# Patient Record
Sex: Female | Born: 1985 | Race: Black or African American | Hispanic: No | Marital: Married | Smoking: Never smoker
Health system: Southern US, Community
[De-identification: ages and names within clinical notes are randomized; demographics above are authoritative.]

## PROBLEM LIST (undated history)

## (undated) ENCOUNTER — Inpatient Hospital Stay (HOSPITAL_COMMUNITY): Payer: Self-pay

## (undated) DIAGNOSIS — D500 Iron deficiency anemia secondary to blood loss (chronic): Secondary | ICD-10-CM

## (undated) DIAGNOSIS — M0520 Rheumatoid vasculitis with rheumatoid arthritis of unspecified site: Secondary | ICD-10-CM

## (undated) DIAGNOSIS — M069 Rheumatoid arthritis, unspecified: Secondary | ICD-10-CM

## (undated) DIAGNOSIS — L509 Urticaria, unspecified: Secondary | ICD-10-CM

## (undated) HISTORY — DX: Urticaria, unspecified: L50.9

## (undated) HISTORY — DX: Iron deficiency anemia secondary to blood loss (chronic): D50.0

## (undated) HISTORY — DX: Rheumatoid vasculitis with rheumatoid arthritis of unspecified site: M05.20

---

## 2010-03-20 ENCOUNTER — Emergency Department (HOSPITAL_COMMUNITY): Admission: EM | Admit: 2010-03-20 | Discharge: 2010-03-20 | Payer: Self-pay | Admitting: Emergency Medicine

## 2010-08-09 ENCOUNTER — Ambulatory Visit: Payer: Self-pay | Admitting: Nurse Practitioner

## 2010-08-09 ENCOUNTER — Other Ambulatory Visit: Admission: RE | Admit: 2010-08-09 | Discharge: 2010-08-09 | Payer: Self-pay | Admitting: Internal Medicine

## 2010-08-09 DIAGNOSIS — E669 Obesity, unspecified: Secondary | ICD-10-CM

## 2010-08-09 DIAGNOSIS — J45909 Unspecified asthma, uncomplicated: Secondary | ICD-10-CM | POA: Insufficient documentation

## 2010-08-09 DIAGNOSIS — N644 Mastodynia: Secondary | ICD-10-CM | POA: Insufficient documentation

## 2010-08-09 DIAGNOSIS — B3731 Acute candidiasis of vulva and vagina: Secondary | ICD-10-CM | POA: Insufficient documentation

## 2010-08-09 DIAGNOSIS — B373 Candidiasis of vulva and vagina: Secondary | ICD-10-CM

## 2010-08-09 LAB — CONVERTED CEMR LAB: Chlamydia, DNA Probe: NEGATIVE

## 2010-08-16 ENCOUNTER — Encounter (INDEPENDENT_AMBULATORY_CARE_PROVIDER_SITE_OTHER): Payer: Self-pay | Admitting: Nurse Practitioner

## 2011-01-09 ENCOUNTER — Ambulatory Visit: Admit: 2011-01-09 | Payer: Self-pay | Admitting: Nurse Practitioner

## 2011-01-09 NOTE — Letter (Signed)
Summary: Handout Printed  Printed Handout:  - Fibrocystic Breast Disease 

## 2011-01-09 NOTE — Letter (Signed)
Summary: *HSN Results Follow up  [facility]  [address] [place], [place] [address]   Phone: [phone]  Fax: [phone]      08/16/2010   [place] ASHLEY HARPER [address] [place], [place]  [address]   Dear  Ms. Shanie HARPER,                            ____S.Drinkard,FNP   ____D. Gore,FNP       ____B. McPherson,MD   ____V. Rankins,MD    ____E. Mulberry,MD    __X__N. Daphine Deutscher, FNP  ____D. Reche Dixon, MD    ____K. Philipp Deputy, MD    ____Other     This letter is to inform you that your recent test(s):  ___X____Pap Smear    _______Lab Test     _______X-ray    ____X___ is within acceptable limits  _______ requires a medication change  _______ requires a follow-up lab visit  _______ requires a follow-up visit with your provider   Comments: Pap Smear results are normal.       _________________________________________________________ If you have any questions, please contact our office [phone].                    Sincerely,    Lehman Prom FNP [facility]

## 2011-01-09 NOTE — Letter (Signed)
Summary: PT INFORMATION SHEET  PT INFORMATION SHEET   Imported By: Arta Bruce 08/10/2010 11:31:53  _____________________________________________________________________  External Attachment:    Type:   Image     Comment:   External Document

## 2011-01-09 NOTE — Assessment & Plan Note (Signed)
Summary: NEW - Establish Care   Vital Signs:  Patient profile:   25 year old female LMP:     07/2010 Height:      57.50 inches Weight:      160.4 pounds BMI:     34.23 BSA:     1.65 Temp:     97.8 degrees F oral Pulse rate:   84 / minute Pulse rhythm:   regular Resp:     20 per minute BP sitting:   120 / 70  (left arm) Cuff size:   regular  Vitals Entered By: Levon Hedger (August 09, 2010 3:05 PM)  Nutrition Counseling: Patient's BMI is greater than 25 and therefore counseled on weight management options. CC: new establish...birth control..possible lump in right breast Is Patient Diabetic? No Pain Assessment Patient in pain? no       Does patient need assistance? Functional Status Self care Ambulation Normal LMP (date): 07/2010     Enter LMP: 07/2010   CC:  new establish...birth control..possible lump in right breast.  History of Present Illness:  Pt into the [facility] office to establish care. Previously seen in [place] Moved to [facility] in 11/2009  PMH - Asthma PSH - C-section x 1  Social - pt has 1 child age 19.  She has a fiance' and is employed at [employer]  IUD - Nuvaring initially after birth of son and she had complications with it staying in place.  Mirena placed in 01/2009.  Currently with some problems that she attributes to IUD Pain with intercourse Some vaginal discharge no dysuria no change in sexual partners Menses - monthly (light flow) last PAP done 2009  Breast tender - ? lump in right breast tender to the touch at times No family history of breast cancer +coffee -chocolate  Habits & Providers  Alcohol-Tobacco-Diet     Alcohol drinks/day: 0     Tobacco Status: never  Exercise-Depression-Behavior     Have you felt down or hopeless? no     Drug Use: never  Medications Prior to Update: 1)  None  Allergies (verified): No Known Drug Allergies  Past History:  Past Surgical History: c-section x 1  Family History: mother -  Hx DVT and PE, thyroid problems father - healthy sister x 2 (healthy)  Social History: 1 child tobacco - none ETOH - social  Drug - noneSmoking Status:  never Drug Use:  never  Review of Systems CV:  Denies chest pain or discomfort. Resp:  Denies cough. GI:  Denies abdominal pain, nausea, and vomiting. GU:  Complains of discharge; painful coitus.  Physical Exam  General:  alert.   Head:  normocephalic.   Breasts:  nippled everted no masses noted but dense glandular tissue bilateral Lungs:  normal breath sounds.   Heart:  normal rate and regular rhythm.    Pelvic Exam  Vulva:      normal appearance.   Urethra and Bladder:      Urethra--normal.   Vagina:      curdlike discharge.  IUD string in place Cervix:      midposition.   Uterus:      smooth.   Adnexa:      nontender bilaterally.      Impression & Recommendations:  Problem # 1:  CANDIDIASIS, VAGINAL (ICD-112.1) handout given dx reviewed with pt  Problem # 2:  BREAST TENDERNESS (ICD-611.71) handout given self breast exam placcard given  Problem # 3:  IUD SURVEILLANCE (ICD-V25.42)  IUD string in  place  Orders: KOH/ WET [place] [phone]) Pap Smear, Thin Prep ( Collection of) [phone]) T- GC Chlamydia ([address])  Problem # 4:  OBESITY (ICD-278.00) will refer to pt to a nutrition class pt is aware that she is overweight for her height  Complete Medication List: 1)  Ventolin Hfa 108 (90 Base) Mcg/act Aers (Albuterol sulfate) .... Two puffs every 6 hours as needed for shortness of breath 2)  Terazol 7 0.4 % Crea (Terconazole) .... One applicator intravaginally x 7 nights  Patient Instructions: 1)  You have a yeast infection which was likely causing vaginal irritation.  You will be notified of the PAP smear results 2)  Follow up as needed Prescriptions: TERAZOL 7 0.4 % CREA (TERCONAZOLE) One applicator intravaginally x 7 nights  #45gm x 0   Entered and Authorized by:   Lehman Prom FNP   Signed by:    Lehman Prom FNP on 08/09/2010   Method used:   Print then Give to Patient   RxID:   [id number]   Laboratory Results  Date/Time Received: August 09, 2010 4:01 PM   [facility] Source: vaginal WBC/hpf: 1-5 Bacteria/hpf: rare Clue cells/hpf: none Yeast/hpf: moderate Wet [place] KOH: Negative Trichomonas/hpf: none

## 2011-01-09 NOTE — Letter (Signed)
Summary: Handout Printed  Printed Handout:  - Breast Problems and Self Exam

## 2011-05-02 ENCOUNTER — Ambulatory Visit (INDEPENDENT_AMBULATORY_CARE_PROVIDER_SITE_OTHER): Payer: Medicaid Other | Admitting: Family Medicine

## 2011-05-02 DIAGNOSIS — Z30432 Encounter for removal of intrauterine contraceptive device: Secondary | ICD-10-CM

## 2011-05-23 ENCOUNTER — Ambulatory Visit (INDEPENDENT_AMBULATORY_CARE_PROVIDER_SITE_OTHER): Payer: Medicaid Other | Admitting: Family Medicine

## 2011-05-23 DIAGNOSIS — Z3009 Encounter for other general counseling and advice on contraception: Secondary | ICD-10-CM

## 2011-05-24 ENCOUNTER — Other Ambulatory Visit: Payer: Self-pay | Admitting: Family Medicine

## 2011-05-24 LAB — POCT URINALYSIS DIP (DEVICE)
Bilirubin Urine: NEGATIVE
Glucose, UA: NEGATIVE mg/dL
Hgb urine dipstick: NEGATIVE
Specific Gravity, Urine: >=1.03 (ref 1.005–1.030)
Urobilinogen, UA: 1 mg/dL (ref 0.0–1.0)

## 2011-05-24 NOTE — Group Therapy Note (Unsigned)
[name], [name]                 ACCOUNT NO.:  [id number]  MEDICAL RECORD NO.:  [id number]           PATIENT TYPE:  A  LOCATION:  [facility]                   FACILITY:  [facility]  PHYSICIAN:  Lucina Mellow, DO   DATE OF BIRTH:  September 27, 1986  DATE OF SERVICE:  05/23/2011                                 CLINIC NOTE  The patient presents to the clinic on May 23, 2011.  The patient presents to the GYN clinic with followup after having her IUD removed 2 weeks ago by certified nurse midwife [facility].  The patient states that after her IUD was removed, she has had no additional pain and feels much better not having the IUD in.  She thinks that she does not want an IUD replaced at this point, especially since her fiance has started the discussion about having children fairly soon after they get married.  The patient states that she has taken birth control pills in the past has a hard time remembering them.  She is really not that interested in the Depo or the Implanon placement at this time.  She states that she has recently started her period since her IUD was removed and so far her bleeding is light and normal.  She is not having any difficult symptoms.  On exam, blood pressure 115/68, pulse is 70, temperature of 98.1, weight of 154.1 pounds, height of 58 inches.  The patient is a pleasant African American female who looks her stated age of 64, heart is regular rate and rhythm.  Lungs are clear to auscultation bilaterally.  Her thyroid is not palpable.  ASSESSMENT: 1. Family planning.  The patient states that she does not want to have     any IUD placed at this time.  Especially since the IUD is meant for     5 years of contraception and she feels at least within the next 2     years she would want to conceive again.  We discussed her options     at length and she opted to try birth control pill a prescription     for Sprintec was written for her.  We discussed  Sprintec being  a     monophasic birth control pill that if she were to miss a pill she     could easily double up, the next day and to control her symptoms.     If that were to happen, she does need to use an additional form of     contraception for about 72 hours.  She can start it today as the     patient did start her periods a few days ago.  We also discussed if     she decided that she wanted to piggyback her doses and skip the     procedure, that would also be safe and effective way to continue     birth control pill contraception if she noted she was going to have     an activity, she did not want to be on her cycle during it.  We     also discussed that birth control  pills can raise her blood     pressure and she is to follow up within 3 months so that we can     check her blood pressure and make sure that she is doing well     remembering her pills.  I also suggested to her that she should     keep her birth control pills by her toothbrush, and when she     brushes her teeth each day, she can remember to take her pill.  She     can set an alarm on her telephone to remind her to check her pill     each day or she can even elicit the help of her fiance to help her     remember to take it each day.  The patient voices understanding and     agrees with all of this, and she leaves the clinic in stable     condition.          ______________________________ Lucina Mellow, DO    SH/MEDQ  D:  05/23/2011  T:  05/24/2011  Job:  [id number]

## 2011-12-20 ENCOUNTER — Telehealth: Payer: Self-pay | Admitting: *Deleted

## 2011-12-20 NOTE — Telephone Encounter (Signed)
Patient called stating has abdominal cramping and is pregnant but not sure how far along. Pt would like a phone call back about what to do.

## 2011-12-20 NOTE — Telephone Encounter (Signed)
Called pt and left message to return our call to the clinics.  

## 2011-12-26 ENCOUNTER — Encounter (INDEPENDENT_AMBULATORY_CARE_PROVIDER_SITE_OTHER): Payer: Medicaid Other | Admitting: Family Medicine

## 2011-12-26 NOTE — Progress Notes (Signed)
Pt states she is pregnant and thought this was an appt for prenatal care. I explained that there must have been a misunderstanding. The appt given to her today was intended to be a Gyn appt and we will not be able to see her today for pregnancy care. I further stated that we are primarily a high risk referral clinic and pts are sent to [place] from the [facility]. HD when indicated. Pt states that she had called and spoke with an after hours nurse last week who advised her to be seen within 24 hrs. I stated that she may have meant for her to go to [facility] because she was having pain at the time. Pt says "No, she told me to schedule an appt." I apologized once again for the confusion and for the time that she has waited to be seen. I stated that she can go to [facility] if she feels she needs to be seen right away, however she has stated that she is not having pain at present and that area is for pts having urgent concerns and problems.  I advised pt to go to the registration window for further instructions from [place] to obtain prenatal care. Pt and her husband left the clinic but did not stop back @ the registration window to get instructions from [place].

## 2011-12-27 NOTE — Progress Notes (Deleted)
This encounter was created in error - please disregard.

## 2012-01-03 ENCOUNTER — Encounter (HOSPITAL_COMMUNITY): Payer: Self-pay | Admitting: *Deleted

## 2012-01-03 ENCOUNTER — Inpatient Hospital Stay (HOSPITAL_COMMUNITY)
Admission: AD | Admit: 2012-01-03 | Discharge: 2012-01-03 | Disposition: A | Discharge status: Home / Self Care | Payer: Medicaid Other | Source: Ambulatory Visit | Attending: Obstetrics & Gynecology | Admitting: Obstetrics & Gynecology

## 2012-01-03 ENCOUNTER — Inpatient Hospital Stay (HOSPITAL_COMMUNITY): Payer: Medicaid Other

## 2012-01-03 DIAGNOSIS — O99891 Other specified diseases and conditions complicating pregnancy: Secondary | ICD-10-CM | POA: Insufficient documentation

## 2012-01-03 DIAGNOSIS — O9989 Other specified diseases and conditions complicating pregnancy, childbirth and the puerperium: Secondary | ICD-10-CM

## 2012-01-03 DIAGNOSIS — R109 Unspecified abdominal pain: Secondary | ICD-10-CM | POA: Insufficient documentation

## 2012-01-03 LAB — CBC
HCT: 39.9 % (ref 36.0–46.0)
Hemoglobin: 13.1 g/dL (ref 12.0–15.0)
RBC: 4.97 MIL/uL (ref 3.87–5.11)

## 2012-01-03 LAB — WET PREP, GENITAL

## 2012-01-03 LAB — DIFFERENTIAL
Lymphocytes Relative: 33 % (ref 12–46)
Lymphs Abs: 2.1 10*3/uL (ref 0.7–4.0)
Monocytes Relative: 11 % (ref 3–12)
Neutro Abs: 3.5 10*3/uL (ref 1.7–7.7)
Neutrophils Relative %: 55 % (ref 43–77)

## 2012-01-03 LAB — URINALYSIS, ROUTINE W REFLEX MICROSCOPIC
Glucose, UA: NEGATIVE mg/dL
Hgb urine dipstick: NEGATIVE
Specific Gravity, Urine: 1.015 (ref 1.005–1.030)

## 2012-01-03 LAB — ABO/RH: ABO/RH(D): O POS

## 2012-01-03 NOTE — ED Provider Notes (Signed)
History     CSN: [id number]  Arrival date & time 01/03/12  1812   None     Chief Complaint  Patient presents with  . Abdominal Pain   HPI [name] is a 26 y.o. female who presents to [facility] for abdominal pain in early pregnancy. Pain is located in the lower abdomen and described as cramping pain that feels like a period cramp. Denies vaginal bleeding or discharge. LMP 11/08/11, last pap smear less than one year ago and was normal at [facility]. Current sex partner x 5 years. No history of STI's.  Past Medical History  Diagnosis Date  . Asthma     Past Surgical History  Procedure Date  . Cesarean section     History reviewed. No pertinent family history.  History  Substance Use Topics  . Smoking status: Never Smoker   . Smokeless tobacco: Not on file  . Alcohol Use: Yes    OB History    Grav Para Term Preterm Abortions TAB SAB Ect Mult Living   2 1        1       Review of Systems  Constitutional: Negative for fever, chills, diaphoresis and fatigue.  HENT: Negative for ear pain, congestion, sore throat, facial swelling, neck pain, neck stiffness, dental problem and sinus pressure.   Eyes: Negative for photophobia, pain and discharge.  Respiratory: Negative for cough, chest tightness and wheezing.   Gastrointestinal: Positive for abdominal pain (mild cramping). Negative for nausea, vomiting, diarrhea, constipation and abdominal distention.  Genitourinary: Positive for vaginal discharge. Negative for dysuria, frequency, flank pain, vaginal bleeding and difficulty urinating.  Musculoskeletal: Negative for myalgias, back pain and gait problem.  Skin: Negative for color change and rash.  Neurological: Positive for headaches. Negative for dizziness, speech difficulty, weakness, light-headedness and numbness.  Psychiatric/Behavioral: Negative for confusion and agitation.    Allergies  Review of patient's allergies indicates no known allergies.  Home Medications    No current outpatient prescriptions on file.  BP 137/70  Pulse 81  Temp(Src) 98.8 F (37.1 C) (Oral)  Resp 20  Ht 4' 10.5" (1.486 m)  Wt 161 lb (73.029 kg)  BMI 33.08 kg/m2  SpO2 99%  LMP 11/09/2011  Physical Exam  Nursing note and vitals reviewed. Constitutional: She is oriented to person, place, and time. She appears well-developed and well-nourished.  HENT:  Head: Normocephalic.  Eyes: EOM are normal.  Neck: Neck supple.  Cardiovascular: Normal rate.   Pulmonary/Chest: Effort normal.  Abdominal: Soft. There is no tenderness.  Genitourinary:       External genitalia without lesions. White creamy discharge vaginal vault. Cervix closed, long, no CMT, mildly tender left adnexa, no mass palpated. Uterus slightly enlarged.  Musculoskeletal: Normal range of motion.  Neurological: She is alert and oriented to person, place, and time. No cranial nerve deficit.  Skin: Skin is warm and dry.  Psychiatric: She has a normal mood and affect. Her behavior is normal. Judgment and thought content normal.   Results for orders placed during the hospital encounter of 01/03/12 (from the past 24 hour(s))  URINALYSIS, ROUTINE W REFLEX MICROSCOPIC     Status: Abnormal   Collection Time   01/03/12  7:00 PM      Component Value Range   Color, Urine YELLOW  YELLOW    APPearance CLEAR  CLEAR    Specific Gravity, Urine 1.015  1.005 - 1.030    pH 7.0  5.0 - 8.0    Glucose,  UA NEGATIVE  NEGATIVE (mg/dL)   Hgb urine dipstick NEGATIVE  NEGATIVE    Bilirubin Urine NEGATIVE  NEGATIVE    Ketones, ur 15 (*) NEGATIVE (mg/dL)   Protein, ur NEGATIVE  NEGATIVE (mg/dL)   Urobilinogen, UA 1.0  0.0 - 1.0 (mg/dL)   Nitrite NEGATIVE  NEGATIVE    Leukocytes, UA NEGATIVE  NEGATIVE   POCT PREGNANCY, URINE     Status: Abnormal   Collection Time   01/03/12  7:59 PM      Component Value Range   Preg Test, Ur POSITIVE (*) NEGATIVE   CBC     Status: Normal   Collection Time   01/03/12  8:20 PM      Component Value  Range   WBC 6.4  4.0 - 10.5 (K/uL)   RBC 4.97  3.87 - 5.11 (MIL/uL)   Hemoglobin 13.1  12.0 - 15.0 (g/dL)   HCT 45.4  09.8 - 11.9 (%)   MCV 80.3  78.0 - 100.0 (fL)   MCH 26.4  26.0 - 34.0 (pg)   MCHC 32.8  30.0 - 36.0 (g/dL)   RDW 14.7  82.9 - 56.2 (%)   Platelets 312  150 - 400 (K/uL)  DIFFERENTIAL     Status: Normal   Collection Time   01/03/12  8:20 PM      Component Value Range   Neutrophils Relative 55  43 - 77 (%)   Neutro Abs 3.5  1.7 - 7.7 (K/uL)   Lymphocytes Relative 33  12 - 46 (%)   Lymphs Abs 2.1  0.7 - 4.0 (K/uL)   Monocytes Relative 11  3 - 12 (%)   Monocytes Absolute 0.7  0.1 - 1.0 (K/uL)   Eosinophils Relative 1  0 - 5 (%)   Eosinophils Absolute 0.0  0.0 - 0.7 (K/uL)   Basophils Relative 1  0 - 1 (%)   Basophils Absolute 0.0  0.0 - 0.1 (K/uL)  ABO/RH     Status: Normal   Collection Time   01/03/12  8:20 PM      Component Value Range   ABO/RH(D) O POS    WET PREP, GENITAL     Status: Abnormal   Collection Time   01/03/12  8:26 PM      Component Value Range   Yeast, Wet Prep NONE SEEN  NONE SEEN    Trich, Wet Prep NONE SEEN  NONE SEEN    Clue Cells, Wet Prep FEW (*) NONE SEEN    WBC, Wet Prep HPF POC FEW (*) NONE SEEN    US Ob Comp Less 14 Wks  01/03/2012  *RADIOLOGY REPORT*  Clinical Data: Pelvic cramping.  OBSTETRIC <14 WK ULTRASOUND  Technique:  Transabdominal ultrasound was performed for evaluation of the gestation as well as the maternal uterus and adnexal regions.  Comparison:  None.  Intrauterine gestational sac: Visualized/normal in shape. Yolk sac: Yes Embryo: Yes Cardiac Activity: Yes Heart Rate: 155 bpm  CRL:  1.54 cm  8w  0d         [place] EDC: 08/14/2012  Maternal uterus/Adnexae: The uterus is otherwise unremarkable in appearance.  No subchorionic hemorrhage is seen.  The ovaries are within normal limits.  The right ovary measures 3.5 x 1.7 x 1.4 cm, while the left ovary measures 2.0 x 1.5 x 1.7 cm. No suspicious adnexal masses are seen.  There is no  evidence for ovarian torsion.  No free fluid is seen within the pelvic cul-de-sac.  IMPRESSION: Single  live intrauterine pregnancy noted, with a crown-rump length of 1.54 cm, corresponding to a gestational age of [redacted] weeks 0 days. This matches the gestational age of [redacted] weeks 6 days by LMP, reflecting an estimated date of delivery of August 15, 2012.  Original Report Authenticated By: Tonia Ghent, M.D.   Assessment: Viable IUP   Abdominal pain in early pregnany  Plan:  Start prenatal Care   Return as needed for problems ED Course  Procedures   MDM          [facility], NP 01/03/12 2143

## 2012-01-03 NOTE — Progress Notes (Signed)
+  HPT wk or 2 ago.  Cramping.

## 2012-01-16 ENCOUNTER — Encounter (HOSPITAL_COMMUNITY): Payer: Self-pay | Admitting: *Deleted

## 2012-01-16 ENCOUNTER — Emergency Department (HOSPITAL_COMMUNITY)
Admission: EM | Admit: 2012-01-16 | Discharge: 2012-01-16 | Disposition: A | Discharge status: Home / Self Care | Payer: Medicaid Other | Attending: Emergency Medicine | Admitting: Emergency Medicine

## 2012-01-16 DIAGNOSIS — J40 Bronchitis, not specified as acute or chronic: Secondary | ICD-10-CM

## 2012-01-16 DIAGNOSIS — R059 Cough, unspecified: Secondary | ICD-10-CM | POA: Insufficient documentation

## 2012-01-16 DIAGNOSIS — R070 Pain in throat: Secondary | ICD-10-CM | POA: Insufficient documentation

## 2012-01-16 DIAGNOSIS — J45909 Unspecified asthma, uncomplicated: Secondary | ICD-10-CM | POA: Insufficient documentation

## 2012-01-16 DIAGNOSIS — R05 Cough: Secondary | ICD-10-CM | POA: Insufficient documentation

## 2012-01-16 DIAGNOSIS — O9989 Other specified diseases and conditions complicating pregnancy, childbirth and the puerperium: Secondary | ICD-10-CM | POA: Insufficient documentation

## 2012-01-16 MED ORDER — AMOXICILLIN 500 MG PO CAPS: 500.0000 mg | ORAL_CAPSULE | Freq: Three times a day (TID) | ORAL | Status: AC

## 2012-01-16 NOTE — ED Provider Notes (Signed)
History     CSN: [id number]  Arrival date & time 01/16/12  4098   First MD Initiated Contact with Patient 01/16/12 1050      Chief Complaint  Patient presents with  . Influenza    (Consider location/radiation/quality/duration/timing/severity/associated sxs/prior treatment) Patient is a 26 y.o. female presenting with flu symptoms. The history is provided by the patient.  Influenza   patient here complaining of cough x5 days has been nonproductive. No fever, vomiting, diarrhea. Has been using over-the-counter medications with slight relief. Notes positive sick exposures at home. Some sore throat, no ear pain. Denies any neck pain or stiffness. Denies being short of breath. No urinary symptoms, denies any vaginal bleeding. Patient is [redacted] weeks pregnant. Nothing makes her symptoms worse  Past Medical History  Diagnosis Date  . Asthma     Past Surgical History  Procedure Date  . Cesarean section     History reviewed. No pertinent family history.  History  Substance Use Topics  . Smoking status: Never Smoker   . Smokeless tobacco: Not on file  . Alcohol Use: Yes    OB History    Grav Para Term Preterm Abortions TAB SAB Ect Mult Living   2 1        1       Review of Systems  All other systems reviewed and are negative.    Allergies  Review of patient's allergies indicates no known allergies.  Home Medications   Current Outpatient Rx  Name Route Sig Dispense Refill  . PSEUDOEPHEDRINE HCL ER 120 MG PO TB12 Oral Take 120 mg by mouth daily as needed. Congestion      BP 114/67  Pulse 95  Temp(Src) 98 F (36.7 C) (Oral)  Resp 16  SpO2 95%  LMP 11/09/2011  Physical Exam  Nursing note and vitals reviewed. Constitutional: She is oriented to person, place, and time. She appears well-developed and well-nourished.  Non-toxic appearance.  HENT:  Head: Normocephalic and atraumatic.  Eyes: Conjunctivae are normal. Pupils are equal, round, and reactive to light.  Neck:  Normal range of motion.  Cardiovascular: Normal rate.   Pulmonary/Chest: Effort normal.  Neurological: She is alert and oriented to person, place, and time.  Skin: Skin is warm and dry.  Psychiatric: She has a normal mood and affect.    ED Course  Procedures (including critical care time)  Labs Reviewed - No data to display No results found.   No diagnosis found.    MDM  Patient will be given prescription for amoxicillin to take only if she gets worse with her current symptoms.        Toy Baker, MD 01/16/12 [phone]

## 2012-01-16 NOTE — ED Notes (Signed)
Pt here  With flu like symptoms that started this past sat. Pt has a cough and body aches , pt is [redacted] weeks pregnant

## 2012-02-02 ENCOUNTER — Inpatient Hospital Stay (HOSPITAL_COMMUNITY)
Admission: AD | Admit: 2012-02-02 | Discharge: 2012-02-02 | Disposition: A | Discharge status: Home / Self Care | Payer: Medicaid Other | Source: Ambulatory Visit | Attending: Obstetrics and Gynecology | Admitting: Obstetrics and Gynecology

## 2012-02-02 ENCOUNTER — Encounter (HOSPITAL_COMMUNITY): Payer: Self-pay | Admitting: Advanced Practice Midwife

## 2012-02-02 DIAGNOSIS — N760 Acute vaginitis: Secondary | ICD-10-CM | POA: Insufficient documentation

## 2012-02-02 DIAGNOSIS — N949 Unspecified condition associated with female genital organs and menstrual cycle: Secondary | ICD-10-CM

## 2012-02-02 DIAGNOSIS — O2390 Unspecified genitourinary tract infection in pregnancy, unspecified trimester: Secondary | ICD-10-CM | POA: Insufficient documentation

## 2012-02-02 DIAGNOSIS — A499 Bacterial infection, unspecified: Secondary | ICD-10-CM | POA: Insufficient documentation

## 2012-02-02 DIAGNOSIS — B9689 Other specified bacterial agents as the cause of diseases classified elsewhere: Secondary | ICD-10-CM | POA: Insufficient documentation

## 2012-02-02 DIAGNOSIS — M545 Low back pain, unspecified: Secondary | ICD-10-CM | POA: Insufficient documentation

## 2012-02-02 DIAGNOSIS — R1032 Left lower quadrant pain: Secondary | ICD-10-CM | POA: Insufficient documentation

## 2012-02-02 LAB — URINALYSIS, ROUTINE W REFLEX MICROSCOPIC
Ketones, ur: 40 mg/dL — AB
Leukocytes, UA: NEGATIVE
Nitrite: NEGATIVE
Specific Gravity, Urine: 1.015 (ref 1.005–1.030)
pH: 6 (ref 5.0–8.0)

## 2012-02-02 LAB — WET PREP, GENITAL: Yeast Wet Prep HPF POC: NONE SEEN

## 2012-02-02 LAB — URINE MICROSCOPIC-ADD ON

## 2012-02-02 MED ORDER — CYCLOBENZAPRINE HCL 10 MG PO TABS
10.0000 mg | ORAL_TABLET | Freq: Once | ORAL | Status: AC
  Administered 2012-02-02: 10 mg via ORAL
  Filled 2012-02-02: qty 1

## 2012-02-02 MED ORDER — IBUPROFEN 600 MG PO TABS
600.0000 mg | ORAL_TABLET | Freq: Once | ORAL | Status: AC
  Administered 2012-02-02: 600 mg via ORAL
  Filled 2012-02-02: qty 1

## 2012-02-02 MED ORDER — METRONIDAZOLE 500 MG PO TABS: 500.0000 mg | ORAL_TABLET | Freq: Two times a day (BID) | ORAL | Status: AC

## 2012-02-02 NOTE — Progress Notes (Signed)
Persistent abdominal pain and back pain x 2 to 3 days, no vaginal bleeding no discharge 12 weeks

## 2012-02-02 NOTE — ED Provider Notes (Signed)
History     Chief Complaint  Patient presents with  . Abdominal Pain  . Back Pain   HPI 26 y.o. G2P1 at [redacted]w[redacted]d c/o persistent left lower quadrant and low back pain x 3 days. Some improvement with massage. No bleeding, discharge, n/v, fever, chills, dysuria. Has not started prenatal care yet, but has called for appointment.   Past Medical History  Diagnosis Date  . Asthma     Past Surgical History  Procedure Date  . Cesarean section     No family history on file.  History  Substance Use Topics  . Smoking status: Never Smoker   . Smokeless tobacco: Not on file  . Alcohol Use: Yes    Allergies: No Known Allergies  Prescriptions prior to admission  Medication Sig Dispense Refill  . albuterol (PROVENTIL HFA;VENTOLIN HFA) 108 (90 BASE) MCG/ACT inhaler Inhale 2 puffs into the lungs every 6 (six) hours as needed. Asthma attacks, emergency use only.      . Prenatal Vit-Fe Fumarate-FA (PRENATAL MULTIVITAMIN) TABS Take 1 tablet by mouth at bedtime.      Marland Kitchen DISCONTD: AMOXICILLIN PO Take 1 tablet by mouth 2 (two) times daily. Pt does not know strength, she D/Cd this herself after 2 days.        Review of Systems  Constitutional: Negative.   Respiratory: Negative.   Cardiovascular: Negative.   Gastrointestinal: Positive for abdominal pain. Negative for nausea, vomiting, diarrhea and constipation.  Genitourinary: Negative for dysuria, urgency, frequency, hematuria and flank pain.       Negative for vaginal bleeding, vaginal discharge  Musculoskeletal: Positive for back pain.  Neurological: Negative.   Psychiatric/Behavioral: Negative.    Physical Exam   Blood pressure 108/72, pulse 116, temperature 97.7 F (36.5 C), temperature source Oral, resp. rate 16, height 4\' 10"  (1.473 m), weight 155 lb 6.4 oz (70.489 kg), last menstrual period 11/09/2011.  Physical Exam  Constitutional: She is oriented to person, place, and time. She appears well-developed and well-nourished. No  distress.  HENT:  Head: Normocephalic and atraumatic.  Cardiovascular: Normal rate, regular rhythm and normal heart sounds.   Respiratory: Effort normal and breath sounds normal. No respiratory distress.  GI: Soft. Bowel sounds are normal. She exhibits no distension and no mass. There is tenderness (LLQ). There is no rebound, no guarding and no CVA tenderness.  Genitourinary: There is no rash or lesion on the right labia. There is no rash or lesion on the left labia. Uterus is not deviated, not enlarged, not fixed and not tender. Cervix exhibits no motion tenderness, no discharge and no friability. Right adnexum displays no mass, no tenderness and no fullness. Left adnexum displays no mass, no tenderness and no fullness. No erythema, tenderness or bleeding around the vagina. No vaginal discharge found.  Neurological: She is alert and oriented to person, place, and time.  Skin: Skin is warm and dry.  Psychiatric: She has a normal mood and affect.    MAU Course  Procedures  Results for orders placed during the hospital encounter of 02/02/12 (from the past 24 hour(s))  URINALYSIS, ROUTINE W REFLEX MICROSCOPIC     Status: Abnormal   Collection Time   02/02/12  5:30 PM      Component Value Range   Color, Urine YELLOW  YELLOW    APPearance CLEAR  CLEAR    Specific Gravity, Urine 1.015  1.005 - 1.030    pH 6.0  5.0 - 8.0    Glucose, UA NEGATIVE  NEGATIVE (  mg/dL)   Hgb urine dipstick TRACE (*) NEGATIVE    Bilirubin Urine NEGATIVE  NEGATIVE    Ketones, ur 40 (*) NEGATIVE (mg/dL)   Protein, ur NEGATIVE  NEGATIVE (mg/dL)   Urobilinogen, UA 0.2  0.0 - 1.0 (mg/dL)   Nitrite NEGATIVE  NEGATIVE    Leukocytes, UA NEGATIVE  NEGATIVE   URINE MICROSCOPIC-ADD ON     Status: Abnormal   Collection Time   02/02/12  5:30 PM      Component Value Range   Squamous Epithelial / LPF FEW (*) RARE    RBC / HPF 0-2  <3 (RBC/hpf)   Bacteria, UA RARE  RARE   WET PREP, GENITAL     Status: Abnormal   Collection  Time   02/02/12  5:55 PM      Component Value Range   Yeast Wet Prep HPF POC NONE SEEN  NONE SEEN    Trich, Wet Prep NONE SEEN  NONE SEEN    Clue Cells Wet Prep HPF POC MODERATE (*) NONE SEEN    WBC, Wet Prep HPF POC FEW (*) NONE SEEN      Assessment and Plan  26 y.o. G2P1 at [redacted]w[redacted]d BV - rx flagyl Round ligament pain - rev'd comfort measures  Start prenatal care as soon as possible  Albert Hersch 02/02/2012, 6:30 PM

## 2012-02-04 NOTE — ED Provider Notes (Signed)
Agree with above note.  [name] 02/04/2012 9:01 AM

## 2012-02-05 LAB — OB RESULTS CONSOLE ABO/RH: RH Type: POSITIVE

## 2012-02-05 LAB — OB RESULTS CONSOLE HEPATITIS B SURFACE ANTIGEN: Hepatitis B Surface Ag: NEGATIVE

## 2012-07-17 ENCOUNTER — Other Ambulatory Visit: Payer: Self-pay | Admitting: Obstetrics and Gynecology

## 2012-08-08 ENCOUNTER — Encounter (HOSPITAL_COMMUNITY): Payer: Self-pay | Admitting: Pharmacist

## 2012-08-20 ENCOUNTER — Encounter (HOSPITAL_COMMUNITY): Payer: Self-pay

## 2012-08-20 NOTE — Patient Instructions (Signed)
   Your procedure is scheduled ZO:[id number] September 17th  Enter through the Main Entrance of [facility] at: 10am Pick up the phone at the desk and dial [phone] and inform us of your arrival.  Please call this number if you have any problems the morning of surgery: [phone]  Remember: Do not eat food after midnight: Monday You may have water until 7:30am then nothing Take these medicines the morning of surgery with a SIP OF WATER:  Do not wear jewelry, make-up, or FINGER nail polish No metal in your hair or on your body. Do not wear lotions, powders, perfumes or deodorant. Do not shave 48 hours prior to surgery. Do not bring valuables to the hospital. Contacts, dentures or bridgework may not be worn into surgery.  Leave suitcase in the car. After Surgery it may be brought to your room. For patients being admitted to the hospital, checkout time is 11:00am the day of discharge.     Remember to use your hibiclens as instructed.Please shower with 1/2 bottle the evening before your surgery and the other 1/2 bottle the morning of surgery. Neck down avoiding private area.

## 2012-08-21 ENCOUNTER — Encounter (HOSPITAL_COMMUNITY)
Admission: AD | Disposition: A | Discharge status: Home / Self Care | Payer: Self-pay | Source: Ambulatory Visit | Attending: Obstetrics and Gynecology

## 2012-08-21 ENCOUNTER — Encounter (HOSPITAL_COMMUNITY): Payer: Self-pay | Admitting: *Deleted

## 2012-08-21 ENCOUNTER — Encounter (HOSPITAL_COMMUNITY): Payer: Self-pay | Admitting: Anesthesiology

## 2012-08-21 ENCOUNTER — Inpatient Hospital Stay (HOSPITAL_COMMUNITY): Admission: RE | Admit: 2012-08-21 | Discharge: 2012-08-21 | Payer: Medicaid Other | Source: Ambulatory Visit

## 2012-08-21 ENCOUNTER — Inpatient Hospital Stay (HOSPITAL_COMMUNITY)
Admission: AD | Admit: 2012-08-21 | Discharge: 2012-08-24 | DRG: 766 | Disposition: A | Discharge status: Home / Self Care | Payer: Medicaid Other | Source: Ambulatory Visit | Attending: Obstetrics and Gynecology | Admitting: Obstetrics and Gynecology

## 2012-08-21 ENCOUNTER — Inpatient Hospital Stay (HOSPITAL_COMMUNITY): Payer: Medicaid Other | Admitting: Anesthesiology

## 2012-08-21 DIAGNOSIS — N644 Mastodynia: Secondary | ICD-10-CM

## 2012-08-21 DIAGNOSIS — E669 Obesity, unspecified: Secondary | ICD-10-CM

## 2012-08-21 DIAGNOSIS — J45909 Unspecified asthma, uncomplicated: Secondary | ICD-10-CM

## 2012-08-21 DIAGNOSIS — O34219 Maternal care for unspecified type scar from previous cesarean delivery: Secondary | ICD-10-CM | POA: Diagnosis present

## 2012-08-21 DIAGNOSIS — B373 Candidiasis of vulva and vagina: Secondary | ICD-10-CM

## 2012-08-21 LAB — CBC
Hemoglobin: 12.8 g/dL (ref 12.0–15.0)
MCH: 27.1 pg (ref 26.0–34.0)
MCV: 80.1 fL (ref 78.0–100.0)
RBC: 4.73 MIL/uL (ref 3.87–5.11)
WBC: 8.7 10*3/uL (ref 4.0–10.5)

## 2012-08-21 LAB — PREPARE RBC (CROSSMATCH)

## 2012-08-21 SURGERY — Surgical Case
Anesthesia: Epidural

## 2012-08-21 MED ORDER — SCOPOLAMINE 1 MG/3DAYS TD PT72
1.0000 | MEDICATED_PATCH | Freq: Once | TRANSDERMAL | Status: DC
  Administered 2012-08-21: 1.5 mg via TRANSDERMAL

## 2012-08-21 MED ORDER — PHENYLEPHRINE 40 MCG/ML (10ML) SYRINGE FOR IV PUSH (FOR BLOOD PRESSURE SUPPORT)
80.0000 ug | PREFILLED_SYRINGE | INTRAVENOUS | Status: DC | PRN
  Filled 2012-08-21: qty 5

## 2012-08-21 MED ORDER — LACTATED RINGERS IV SOLN
INTRAVENOUS | Status: DC | PRN
  Administered 2012-08-21 (×3): via INTRAVENOUS

## 2012-08-21 MED ORDER — MORPHINE SULFATE (PF) 0.5 MG/ML IJ SOLN
INTRAMUSCULAR | Status: DC | PRN
  Administered 2012-08-21: 4 mg via EPIDURAL

## 2012-08-21 MED ORDER — TERBUTALINE SULFATE 1 MG/ML IJ SOLN: 0.2500 mg | Freq: Once | INTRAMUSCULAR | Status: DC | PRN

## 2012-08-21 MED ORDER — MEPERIDINE HCL 25 MG/ML IJ SOLN
6.2500 mg | INTRAMUSCULAR | Status: DC | PRN
  Administered 2012-08-21: 6.25 mg via INTRAVENOUS

## 2012-08-21 MED ORDER — OXYCODONE-ACETAMINOPHEN 5-325 MG PO TABS: 1.0000 | ORAL_TABLET | ORAL | Status: DC | PRN

## 2012-08-21 MED ORDER — CEFAZOLIN SODIUM-DEXTROSE 2-3 GM-% IV SOLR
INTRAVENOUS | Status: AC
  Filled 2012-08-21: qty 50

## 2012-08-21 MED ORDER — PHENYLEPHRINE 40 MCG/ML (10ML) SYRINGE FOR IV PUSH (FOR BLOOD PRESSURE SUPPORT)
PREFILLED_SYRINGE | INTRAVENOUS | Status: AC
  Filled 2012-08-21: qty 10

## 2012-08-21 MED ORDER — PHENYLEPHRINE 40 MCG/ML (10ML) SYRINGE FOR IV PUSH (FOR BLOOD PRESSURE SUPPORT): 80.0000 ug | PREFILLED_SYRINGE | INTRAVENOUS | Status: DC | PRN

## 2012-08-21 MED ORDER — OXYTOCIN 40 UNITS IN LACTATED RINGERS INFUSION - SIMPLE MED: 62.5000 mL/h | Freq: Once | INTRAVENOUS | Status: DC

## 2012-08-21 MED ORDER — MEPERIDINE HCL 25 MG/ML IJ SOLN
INTRAMUSCULAR | Status: AC
  Filled 2012-08-21: qty 1

## 2012-08-21 MED ORDER — FENTANYL 2.5 MCG/ML BUPIVACAINE 1/10 % EPIDURAL INFUSION (WH - ANES)
INTRAMUSCULAR | Status: DC | PRN
  Administered 2012-08-21: 12 mL/h via EPIDURAL

## 2012-08-21 MED ORDER — ACETAMINOPHEN 325 MG PO TABS: 650.0000 mg | ORAL_TABLET | ORAL | Status: DC | PRN

## 2012-08-21 MED ORDER — OXYTOCIN 40 UNITS IN LACTATED RINGERS INFUSION - SIMPLE MED
1.0000 m[IU]/min | INTRAVENOUS | Status: DC
  Administered 2012-08-21: 1 m[IU]/min via INTRAVENOUS
  Filled 2012-08-21: qty 1000

## 2012-08-21 MED ORDER — MEPERIDINE HCL 25 MG/ML IJ SOLN
INTRAMUSCULAR | Status: DC | PRN
  Administered 2012-08-21: 12.5 mg via INTRAVENOUS

## 2012-08-21 MED ORDER — LACTATED RINGERS IV SOLN: 500.0000 mL | INTRAVENOUS | Status: DC | PRN

## 2012-08-21 MED ORDER — OXYTOCIN BOLUS FROM INFUSION
500.0000 mL | Freq: Once | INTRAVENOUS | Status: DC
  Filled 2012-08-21: qty 500

## 2012-08-21 MED ORDER — ONDANSETRON HCL 4 MG/2ML IJ SOLN
INTRAMUSCULAR | Status: DC | PRN
  Administered 2012-08-21: 4 mg via INTRAVENOUS

## 2012-08-21 MED ORDER — KETOROLAC TROMETHAMINE 60 MG/2ML IM SOLN
60.0000 mg | Freq: Once | INTRAMUSCULAR | Status: AC | PRN
  Administered 2012-08-21: 60 mg via INTRAMUSCULAR

## 2012-08-21 MED ORDER — PHENYLEPHRINE HCL 10 MG/ML IJ SOLN
INTRAMUSCULAR | Status: DC | PRN
  Administered 2012-08-21 (×4): 80 ug via INTRAVENOUS

## 2012-08-21 MED ORDER — FENTANYL CITRATE 0.05 MG/ML IJ SOLN: 25.0000 ug | INTRAMUSCULAR | Status: DC | PRN

## 2012-08-21 MED ORDER — LIDOCAINE HCL (PF) 1 % IJ SOLN
INTRAMUSCULAR | Status: DC | PRN
  Administered 2012-08-21: 3 mL
  Administered 2012-08-21: 4 mL

## 2012-08-21 MED ORDER — LIDOCAINE HCL (PF) 1 % IJ SOLN: 30.0000 mL | INTRAMUSCULAR | Status: DC | PRN

## 2012-08-21 MED ORDER — EPHEDRINE 5 MG/ML INJ
10.0000 mg | INTRAVENOUS | Status: DC | PRN
  Filled 2012-08-21: qty 4

## 2012-08-21 MED ORDER — KETOROLAC TROMETHAMINE 60 MG/2ML IM SOLN
INTRAMUSCULAR | Status: AC
  Filled 2012-08-21: qty 2

## 2012-08-21 MED ORDER — CITRIC ACID-SODIUM CITRATE 334-500 MG/5ML PO SOLN
30.0000 mL | ORAL | Status: DC | PRN
  Administered 2012-08-21: 30 mL via ORAL
  Filled 2012-08-21: qty 15

## 2012-08-21 MED ORDER — ONDANSETRON HCL 4 MG/2ML IJ SOLN
INTRAMUSCULAR | Status: AC
  Filled 2012-08-21: qty 2

## 2012-08-21 MED ORDER — EPHEDRINE 5 MG/ML INJ: 10.0000 mg | INTRAVENOUS | Status: DC | PRN

## 2012-08-21 MED ORDER — CEFAZOLIN SODIUM-DEXTROSE 2-3 GM-% IV SOLR
INTRAVENOUS | Status: DC | PRN
  Administered 2012-08-21: 2 g via INTRAVENOUS

## 2012-08-21 MED ORDER — LACTATED RINGERS IV SOLN
INTRAVENOUS | Status: DC | PRN
  Administered 2012-08-21: 20:00:00 via INTRAVENOUS

## 2012-08-21 MED ORDER — LACTATED RINGERS IV SOLN: 500.0000 mL | Freq: Once | INTRAVENOUS | Status: DC

## 2012-08-21 MED ORDER — OXYTOCIN 40 UNITS IN LACTATED RINGERS INFUSION - SIMPLE MED
INTRAVENOUS | Status: DC | PRN
  Administered 2012-08-21: 40 [IU] via INTRAVENOUS

## 2012-08-21 MED ORDER — FENTANYL 2.5 MCG/ML BUPIVACAINE 1/10 % EPIDURAL INFUSION (WH - ANES)
14.0000 mL/h | INTRAMUSCULAR | Status: DC
  Administered 2012-08-21 (×2): 14 mL/h via EPIDURAL
  Filled 2012-08-21 (×2): qty 60

## 2012-08-21 MED ORDER — LACTATED RINGERS IV SOLN
INTRAVENOUS | Status: DC
  Administered 2012-08-21 (×2): via INTRAVENOUS

## 2012-08-21 MED ORDER — DIPHENHYDRAMINE HCL 50 MG/ML IJ SOLN: 12.5000 mg | INTRAMUSCULAR | Status: DC | PRN

## 2012-08-21 MED ORDER — ONDANSETRON HCL 4 MG/2ML IJ SOLN
4.0000 mg | Freq: Four times a day (QID) | INTRAMUSCULAR | Status: DC | PRN
  Administered 2012-08-21: 4 mg via INTRAVENOUS
  Filled 2012-08-21: qty 2

## 2012-08-21 MED ORDER — IBUPROFEN 600 MG PO TABS: 600.0000 mg | ORAL_TABLET | Freq: Four times a day (QID) | ORAL | Status: DC | PRN

## 2012-08-21 MED ORDER — MORPHINE SULFATE 0.5 MG/ML IJ SOLN
INTRAMUSCULAR | Status: AC
  Filled 2012-08-21: qty 10

## 2012-08-21 MED ORDER — OXYTOCIN 10 UNIT/ML IJ SOLN
INTRAMUSCULAR | Status: AC
  Filled 2012-08-21: qty 4

## 2012-08-21 MED ORDER — SCOPOLAMINE 1 MG/3DAYS TD PT72
MEDICATED_PATCH | TRANSDERMAL | Status: AC
  Filled 2012-08-21: qty 1

## 2012-08-21 MED ORDER — SODIUM BICARBONATE 8.4 % IV SOLN
INTRAVENOUS | Status: DC | PRN
  Administered 2012-08-21: 5 mL via EPIDURAL

## 2012-08-21 SURGICAL SUPPLY — 28 items
CHLORAPREP W/TINT 26ML (MISCELLANEOUS) ×2 IMPLANT
CLOTH BEACON ORANGE TIMEOUT ST (SAFETY) ×2 IMPLANT
DRSG COVADERM 4X10 (GAUZE/BANDAGES/DRESSINGS) ×2 IMPLANT
ELECT REM PT RETURN 9FT ADLT (ELECTROSURGICAL) ×2
ELECTRODE REM PT RTRN 9FT ADLT (ELECTROSURGICAL) ×1 IMPLANT
EXTRACTOR VACUUM BELL STYLE (SUCTIONS) IMPLANT
GLOVE BIO SURGEON STRL SZ7 (GLOVE) ×4 IMPLANT
GOWN PREVENTION PLUS LG XLONG (DISPOSABLE) ×6 IMPLANT
KIT ABG SYR 3ML LUER SLIP (SYRINGE) IMPLANT
NEEDLE HYPO 25X5/8 SAFETYGLIDE (NEEDLE) IMPLANT
NS IRRIG 1000ML POUR BTL (IV SOLUTION) ×2 IMPLANT
PACK C SECTION WH (CUSTOM PROCEDURE TRAY) ×2 IMPLANT
PAD OB MATERNITY 4.3X12.25 (PERSONAL CARE ITEMS) IMPLANT
RETRACTOR WND ALEXIS 25 LRG (MISCELLANEOUS) ×1 IMPLANT
RTRCTR WOUND ALEXIS 25CM LRG (MISCELLANEOUS) ×2
SLEEVE SCD COMPRESS KNEE MED (MISCELLANEOUS) IMPLANT
STAPLER VISISTAT 35W (STAPLE) IMPLANT
SUT MNCRL 0 VIOLET CTX 36 (SUTURE) ×2 IMPLANT
SUT MONOCRYL 0 CTX 36 (SUTURE) ×2
SUT PDS AB 0 CTX 60 (SUTURE) IMPLANT
SUT PLAIN 2 0 XLH (SUTURE) IMPLANT
SUT VIC AB 0 CT1 27 (SUTURE) ×2
SUT VIC AB 0 CT1 27XBRD ANBCTR (SUTURE) ×2 IMPLANT
SUT VIC AB 2-0 CT1 27 (SUTURE) ×1
SUT VIC AB 2-0 CT1 TAPERPNT 27 (SUTURE) ×1 IMPLANT
TOWEL OR 17X24 6PK STRL BLUE (TOWEL DISPOSABLE) ×4 IMPLANT
TRAY FOLEY CATH 14FR (SET/KITS/TRAYS/PACK) ×2 IMPLANT
WATER STERILE IRR 1000ML POUR (IV SOLUTION) ×2 IMPLANT

## 2012-08-21 NOTE — Op Note (Signed)
08/21/2012  8:50 PM  PATIENT:  [name]  26 y.o. female  PRE-OPERATIVE DIAGNOSIS:  Arrest of active phase, failed TOLAC, repeat Cesarean Section  POST-OPERATIVE DIAGNOSIS:  same  PROCEDURE:  Procedure(s) (LRB) with comments: CESAREAN SECTION (N/A)  SURGEON:  Surgeon(s) and Role:    * Loney Laurence, MD - Primary  ANESTHESIA:   epidural  EBL:  Total I/O In: 2100 [I.V.:2100] Out: 900 [Urine:200; Blood:700]   SPECIMEN:  Source of Specimen:  placenta  DISPOSITION OF SPECIMEN:  PATHOLOGY  COUNTS:  YES  PLAN OF CARE: Admit to inpatient   PATIENT DISPOSITION:  PACU - hemodynamically stable.   Delay start of Pharmacological VTE agent (>24hrs) due to surgical blood loss or risk of bleeding: not applicable  Complications:  none Medications:  Ancef, Pitocin Findings:  Thick meconium. Baby female, Apgars 8,8, weight P; direct OP presentation.   Normal tubes, ovaries and uterus seen.  Reason for Operation: Pt admitted in active labor and only progressed to 5 cm despite pitocin augmentation.  FHTs had prolonged 10 min spontaneous decel to 60s; thick meconium fluid was seen (fluid was previously clear) on transfer to OR.    Technique:  After adequate epidural anesthesia was achieved, the patient was prepped and draped in usual sterile fashion.  A foley catheter was used to drain the bladder.  A pfannanstiel incision was made with the scalpel and carried down to the fascia with the bovie cautery. The fascia was incised in the midline with the scalpel and carried in a transverse curvilinear manner bilaterally.  The fascia was reflected superiorly and inferiorly off the rectus muscles and the muscles split in the midline.  A bowel free portion of the peritoneum was entered bluntly and then extended in a superior and inferior manner with good visualization of the bowel and bladder.  The Alexis instrument was then placed and the vesico-uterine fascia tented up and incised in a  transverse curvilinear manner.  A 2 cm transverse incision was made in the upper portion of the lower uterine segment until the amnion was exposed.   The incision was extended transversely in a blunt manner.  Meconium fluid was noted and the baby delivered in the vertex presentation without complication.  The baby was bulb suctioned and the cord was clamped and cut.  The baby was then handed to awaiting Neonatology.  The placenta was then delivered manually and the uterus cleared of all debris.  The uterine incision was then closed with a running lock stitch of 0 monocryl.  An imbricating layer of 0 monocryl was closed as well. Good hemostasis of the uterine incision was achieved and the abdomen was cleared with irrigation.  The peritoneum was closed with a running stitch of 2-0 vicryl.  This incorporated the rectus muscles as a separate layer.  The fascia was then closed with a running stitch of 0 vicryl.  The subcutaneous layer was closed with interrupted  stitches of 2-0 plain gut.  The skin was closed with staples.  The patient tolerated the procedure well and was returned to the recovery room in stable condition.  All counts were correct times three.  Basya Casavant A

## 2012-08-21 NOTE — Progress Notes (Signed)
Pt changed in MAU to 3 cm.  FHTs NST R SVE now 4/0/-3 AROM clear with head not ballotable.

## 2012-08-21 NOTE — Anesthesia Procedure Notes (Signed)
Epidural Patient location during procedure: OB Start time: 08/21/2012 1:22 PM  Staffing Anesthesiologist: Vestal Crandall A. Performed by: anesthesiologist   Preanesthetic Checklist Completed: patient identified, site marked, surgical consent, pre-op evaluation, timeout performed, IV checked, risks and benefits discussed and monitors and equipment checked  Epidural Patient position: sitting Prep: site prepped and draped and DuraPrep Patient monitoring: continuous pulse ox and blood pressure Approach: midline Injection technique: LOR air  Needle:  Needle type: Tuohy  Needle gauge: 17 G Needle length: 9 cm and 9 Needle insertion depth: 7 cm Catheter type: closed end flexible Catheter size: 19 Gauge Catheter at skin depth: 12 cm Test dose: negative and Other  Assessment Events: blood not aspirated, injection not painful, no injection resistance, negative IV test and no paresthesia  Additional Notes Patient identified. Risks and benefits discussed including failed block, incomplete  Pain control, post dural puncture headache, nerve damage, paralysis, blood pressure Changes, nausea, vomiting, reactions to medications-both toxic and allergic and post Partum back pain. All questions were answered. Patient expressed understanding and wished to proceed. Sterile technique was used throughout procedure. Epidural site was Dressed with sterile barrier dressing. No paresthesias, signs of intravascular injection Or signs of intrathecal spread were encountered.  Patient was more comfortable after the epidural was dosed. Please see RN's note for documentation of vital signs and FHR which are stable.

## 2012-08-21 NOTE — Anesthesia Postprocedure Evaluation (Signed)
Anesthesia Post Note  Patient: [name]  Procedure(s) Performed: Procedure(s) (LRB): CESAREAN SECTION (N/A)  Anesthesia type: Epidural  Patient location: PACU  Post pain: Pain level controlled  Post assessment: Post-op Vital signs reviewed  Last Vitals:  Filed Vitals:   08/21/12 2115  BP:   Pulse: 115  Temp:   Resp: 30    Post vital signs: stable  Level of consciousness: awake  Complications: No apparent anesthesia complications

## 2012-08-21 NOTE — Brief Op Note (Signed)
08/21/2012  8:50 PM  PATIENT:  [name]  26 y.o. female  PRE-OPERATIVE DIAGNOSIS:  Arrest of active phase, failed TOLAC, repeat Cesarean Section  POST-OPERATIVE DIAGNOSIS:  same  PROCEDURE:  Procedure(s) (LRB) with comments: CESAREAN SECTION (N/A)  SURGEON:  Surgeon(s) and Role:    * Loney Laurence, MD - Primary  ANESTHESIA:   epidural  EBL:  Total I/O In: 2100 [I.V.:2100] Out: 900 [Urine:200; Blood:700]   SPECIMEN:  Source of Specimen:  placenta  DISPOSITION OF SPECIMEN:  PATHOLOGY  COUNTS:  YES  PLAN OF CARE: Admit to inpatient   PATIENT DISPOSITION:  PACU - hemodynamically stable.   Delay start of Pharmacological VTE agent (>24hrs) due to surgical blood loss or risk of bleeding: not applicable  Complications:  none Medications:  Ancef, Pitocin Findings:  Thick meconium. Baby female, Apgars 8,8, weight P; direct OP presentation.   Normal tubes, ovaries and uterus seen.  Technique:  After adequate epidural anesthesia was achieved, the patient was prepped and draped in usual sterile fashion.  A foley catheter was used to drain the bladder.  A pfannanstiel incision was made with the scalpel and carried down to the fascia with the bovie cautery. The fascia was incised in the midline with the scalpel and carried in a transverse curvilinear manner bilaterally.  The fascia was reflected superiorly and inferiorly off the rectus muscles and the muscles split in the midline.  A bowel free portion of the peritoneum was entered bluntly and then extended in a superior and inferior manner with good visualization of the bowel and bladder.  The Alexis instrument was then placed and the vesico-uterine fascia tented up and incised in a transverse curvilinear manner.  A 2 cm transverse incision was made in the upper portion of the lower uterine segment until the amnion was exposed.   The incision was extended transversely in a blunt manner.  Meconium fluid was noted and the baby  delivered in the vertex presentation without complication.  The baby was bulb suctioned and the cord was clamped and cut.  The baby was then handed to awaiting Neonatology.  The placenta was then delivered manually and the uterus cleared of all debris.  The uterine incision was then closed with a running lock stitch of 0 monocryl.  An imbricating layer of 0 monocryl was closed as well. Good hemostasis of the uterine incision was achieved and the abdomen was cleared with irrigation.  The peritoneum was closed with a running stitch of 2-0 vicryl.  This incorporated the rectus muscles as a separate layer.  The fascia was then closed with a running stitch of 0 vicryl.  The subcutaneous layer was closed with interrupted  stitches of 2-0 plain gut.  The skin was closed with staples.  The patient tolerated the procedure well and was returned to the recovery room in stable condition.  All counts were correct times three.  Nehan Flaum A

## 2012-08-21 NOTE — MAU Note (Signed)
Patient states she is having contractions every 2-3 minutes. Reports good fetal movement. States she has a little discharge and bloody show.

## 2012-08-21 NOTE — Progress Notes (Signed)
Pt had a prolonged decel to 60s for about 10 minutes spontaneously.  The FHTS have returned to baseline with occasional accels but her cervix has remained 5/C/-2 since 1600 (she only changed from 4 to 5 in the hour before that and needed pitocin augmentation.)  Given the decel in the face of arrest of active phase, I have counseled the patient about the risks, alternatives and benefits of proceeding with a repeat C/S and they agree to proceed.

## 2012-08-21 NOTE — H&P (Signed)
26 y.o. [redacted]w[redacted]d  G2P1001 comes in c/o ctxes.  Otherwise has good fetal movement and no bleeding.  She desires VBAC.   Past Medical History  Diagnosis Date  . Asthma   . Urinary tract infection     Past Surgical History  Procedure Date  . Cesarean section     OB History    Grav Para Term Preterm Abortions TAB SAB Ect Mult Living   2 1 1       1      # Outc Date GA Lbr Len/2nd Wgt Sex Del Anes PTL Lv   1 TRM 2009    M CS  No Yes   Comments: induction, distress after arom   2 CUR               History   Social History  . Marital Status: Single    Spouse Name: N/A    Number of Children: N/A  . Years of Education: N/A   Occupational History  . Not on file.   Social History Main Topics  . Smoking status: Current Every Day Smoker    Types: Cigarettes  . Smokeless tobacco: Never Used  . Alcohol Use: Yes     not with preg  . Drug Use: No  . Sexually Active: Yes   Other Topics Concern  . Not on file   Social History Narrative  . No narrative on file   Review of patient's allergies indicates no known allergies.   Prenatal Course:  Uncomplicated.  Filed Vitals:   08/21/12 0813  BP: 120/74  Pulse: 98  Temp: 97.2 F (36.2 C)  Resp: 18     Lungs/Cor:  NAD Abdomen:  soft, gravid Ex:  no cords, erythema SVE:  1-2/70/-2 FHTs:  120, good STV, NST R Toco:  q3-4   A/P   Probable latent labor (has not changed cervix yet.)  Obs for now.  GBS neg.  [name] A

## 2012-08-21 NOTE — Anesthesia Preprocedure Evaluation (Addendum)
Anesthesia Evaluation  Patient identified by MRN, date of birth, ID band Patient awake    Reviewed: Allergy & Precautions, H&P , Patient's Chart, lab work & pertinent test results  Airway Mallampati: III TM Distance: >3 FB Neck ROM: full    Dental No notable dental hx. (+) Teeth Intact   Pulmonary asthma , Current Smoker,  breath sounds clear to auscultation  Pulmonary exam normal       Cardiovascular negative cardio ROS  Rhythm:regular Rate:Normal     Neuro/Psych negative neurological ROS  negative psych ROS   GI/Hepatic negative GI ROS, Neg liver ROS,   Endo/Other  Morbid obesity  Renal/GU negative Renal ROS  negative genitourinary   Musculoskeletal   Abdominal Normal abdominal exam  (+)   Peds  Hematology negative hematology ROS (+)   Anesthesia Other Findings   Reproductive/Obstetrics (+) Pregnancy (FAILED VBAC for C/S) Previous C/Section                          Anesthesia Physical Anesthesia Plan  ASA: III and Emergent  Anesthesia Plan: Epidural   Post-op Pain Management:    Induction:   Airway Management Planned:   Additional Equipment:   Intra-op Plan:   Post-operative Plan:   Informed Consent: I have reviewed the patients History and Physical, chart, labs and discussed the procedure including the risks, benefits and alternatives for the proposed anesthesia with the patient or authorized representative who has indicated his/her understanding and acceptance.     Plan Discussed with: Anesthesiologist, Surgeon and CRNA  Anesthesia Plan Comments:        Anesthesia Quick Evaluation

## 2012-08-21 NOTE — Transfer of Care (Signed)
Immediate Anesthesia Transfer of Care Note  Patient: [name]  Procedure(s) Performed: Procedure(s) (LRB) with comments: CESAREAN SECTION (N/A)  Patient Location: PACU  Anesthesia Type: Regional  Level of Consciousness: awake, alert  and oriented  Airway & Oxygen Therapy: Patient Spontanous Breathing  Post-op Assessment: Report given to PACU RN and Post -op Vital signs reviewed and stable  Post vital signs: Reviewed and stable  Complications: No apparent anesthesia complications

## 2012-08-21 NOTE — MAU Note (Signed)
Contractions, scant bloody show, no leaking.  Prior c/s; was being induced was 4 cm baby went into distress when they broke her water.  Would like to try for VBAC

## 2012-08-22 ENCOUNTER — Encounter (HOSPITAL_COMMUNITY): Payer: Self-pay | Admitting: Anesthesiology

## 2012-08-22 ENCOUNTER — Encounter (HOSPITAL_COMMUNITY): Payer: Self-pay | Admitting: *Deleted

## 2012-08-22 LAB — CBC
HCT: 33.1 % — ABNORMAL LOW (ref 36.0–46.0)
Hemoglobin: 10.9 g/dL — ABNORMAL LOW (ref 12.0–15.0)
MCH: 26.8 pg (ref 26.0–34.0)
MCHC: 32.9 g/dL (ref 30.0–36.0)
MCV: 81.3 fL (ref 78.0–100.0)
RBC: 4.07 MIL/uL (ref 3.87–5.11)

## 2012-08-22 MED ORDER — NALBUPHINE HCL 10 MG/ML IJ SOLN
5.0000 mg | INTRAMUSCULAR | Status: DC | PRN
  Filled 2012-08-22 (×2): qty 1

## 2012-08-22 MED ORDER — TETANUS-DIPHTH-ACELL PERTUSSIS 5-2.5-18.5 LF-MCG/0.5 IM SUSP: 0.5000 mL | Freq: Once | INTRAMUSCULAR | Status: DC

## 2012-08-22 MED ORDER — DIPHENHYDRAMINE HCL 25 MG PO CAPS
25.0000 mg | ORAL_CAPSULE | ORAL | Status: DC | PRN
  Administered 2012-08-22 (×2): 25 mg via ORAL
  Filled 2012-08-22: qty 1

## 2012-08-22 MED ORDER — SIMETHICONE 80 MG PO CHEW
80.0000 mg | CHEWABLE_TABLET | Freq: Three times a day (TID) | ORAL | Status: DC
  Administered 2012-08-22 – 2012-08-24 (×8): 80 mg via ORAL

## 2012-08-22 MED ORDER — LACTATED RINGERS IV SOLN
INTRAVENOUS | Status: DC
  Administered 2012-08-22: 06:00:00 via INTRAVENOUS

## 2012-08-22 MED ORDER — SODIUM CHLORIDE 0.9 % IJ SOLN: 3.0000 mL | INTRAMUSCULAR | Status: DC | PRN

## 2012-08-22 MED ORDER — KETOROLAC TROMETHAMINE 30 MG/ML IJ SOLN: 30.0000 mg | Freq: Four times a day (QID) | INTRAMUSCULAR | Status: AC | PRN

## 2012-08-22 MED ORDER — PRENATAL MULTIVITAMIN CH
1.0000 | ORAL_TABLET | Freq: Every day | ORAL | Status: DC
  Administered 2012-08-22 – 2012-08-24 (×3): 1 via ORAL
  Filled 2012-08-22 (×3): qty 1

## 2012-08-22 MED ORDER — NALBUPHINE HCL 10 MG/ML IJ SOLN
5.0000 mg | INTRAMUSCULAR | Status: DC | PRN
  Administered 2012-08-22 (×3): 10 mg via SUBCUTANEOUS
  Filled 2012-08-22 (×3): qty 1

## 2012-08-22 MED ORDER — FERROUS SULFATE 325 (65 FE) MG PO TABS
325.0000 mg | ORAL_TABLET | Freq: Two times a day (BID) | ORAL | Status: DC
  Administered 2012-08-22 – 2012-08-24 (×5): 325 mg via ORAL
  Filled 2012-08-22 (×5): qty 1

## 2012-08-22 MED ORDER — IBUPROFEN 600 MG PO TABS
600.0000 mg | ORAL_TABLET | Freq: Four times a day (QID) | ORAL | Status: DC | PRN
  Administered 2012-08-22: 600 mg via ORAL
  Filled 2012-08-22 (×5): qty 1

## 2012-08-22 MED ORDER — ONDANSETRON HCL 4 MG PO TABS: 4.0000 mg | ORAL_TABLET | ORAL | Status: DC | PRN

## 2012-08-22 MED ORDER — FLEET ENEMA 7-19 GM/118ML RE ENEM: 1.0000 | ENEMA | Freq: Every day | RECTAL | Status: DC | PRN

## 2012-08-22 MED ORDER — BISACODYL 10 MG RE SUPP: 10.0000 mg | Freq: Every day | RECTAL | Status: DC | PRN

## 2012-08-22 MED ORDER — SENNOSIDES-DOCUSATE SODIUM 8.6-50 MG PO TABS
2.0000 | ORAL_TABLET | Freq: Every day | ORAL | Status: DC
  Administered 2012-08-22 – 2012-08-23 (×2): 2 via ORAL

## 2012-08-22 MED ORDER — MEASLES, MUMPS & RUBELLA VAC ~~LOC~~ INJ
0.5000 mL | INJECTION | Freq: Once | SUBCUTANEOUS | Status: DC
  Filled 2012-08-22: qty 0.5

## 2012-08-22 MED ORDER — INFLUENZA VIRUS VACC SPLIT PF IM SUSP
0.5000 mL | INTRAMUSCULAR | Status: AC
  Filled 2012-08-22: qty 0.5

## 2012-08-22 MED ORDER — MENTHOL 3 MG MT LOZG: 1.0000 | LOZENGE | OROMUCOSAL | Status: DC | PRN

## 2012-08-22 MED ORDER — OXYTOCIN 40 UNITS IN LACTATED RINGERS INFUSION - SIMPLE MED: 62.5000 mL/h | INTRAVENOUS | Status: AC

## 2012-08-22 MED ORDER — DIBUCAINE 1 % RE OINT: 1.0000 "application " | TOPICAL_OINTMENT | RECTAL | Status: DC | PRN

## 2012-08-22 MED ORDER — WITCH HAZEL-GLYCERIN EX PADS: 1.0000 "application " | MEDICATED_PAD | CUTANEOUS | Status: DC | PRN

## 2012-08-22 MED ORDER — ONDANSETRON HCL 4 MG/2ML IJ SOLN: 4.0000 mg | Freq: Three times a day (TID) | INTRAMUSCULAR | Status: DC | PRN

## 2012-08-22 MED ORDER — OXYCODONE-ACETAMINOPHEN 5-325 MG PO TABS
1.0000 | ORAL_TABLET | ORAL | Status: DC | PRN
  Administered 2012-08-22 – 2012-08-23 (×6): 1 via ORAL
  Administered 2012-08-24: 2 via ORAL
  Administered 2012-08-24: 1 via ORAL
  Filled 2012-08-22 (×2): qty 1
  Filled 2012-08-22: qty 2
  Filled 2012-08-22 (×3): qty 1
  Filled 2012-08-22: qty 2
  Filled 2012-08-22: qty 1

## 2012-08-22 MED ORDER — NALOXONE HCL 0.4 MG/ML IJ SOLN
1.0000 ug/kg/h | INTRAMUSCULAR | Status: DC | PRN
  Filled 2012-08-22: qty 2.5

## 2012-08-22 MED ORDER — DIPHENHYDRAMINE HCL 25 MG PO CAPS
25.0000 mg | ORAL_CAPSULE | Freq: Four times a day (QID) | ORAL | Status: DC | PRN
  Filled 2012-08-22: qty 1

## 2012-08-22 MED ORDER — ZOLPIDEM TARTRATE 5 MG PO TABS: 5.0000 mg | ORAL_TABLET | Freq: Every evening | ORAL | Status: DC | PRN

## 2012-08-22 MED ORDER — METHYLERGONOVINE MALEATE 0.2 MG PO TABS: 0.2000 mg | ORAL_TABLET | ORAL | Status: DC | PRN

## 2012-08-22 MED ORDER — METOCLOPRAMIDE HCL 5 MG/ML IJ SOLN: 10.0000 mg | Freq: Three times a day (TID) | INTRAMUSCULAR | Status: DC | PRN

## 2012-08-22 MED ORDER — LANOLIN HYDROUS EX OINT: 1.0000 "application " | TOPICAL_OINTMENT | CUTANEOUS | Status: DC | PRN

## 2012-08-22 MED ORDER — NALOXONE HCL 0.4 MG/ML IJ SOLN: 0.4000 mg | INTRAMUSCULAR | Status: DC | PRN

## 2012-08-22 MED ORDER — ONDANSETRON HCL 4 MG/2ML IJ SOLN: 4.0000 mg | INTRAMUSCULAR | Status: DC | PRN

## 2012-08-22 MED ORDER — DIPHENHYDRAMINE HCL 50 MG/ML IJ SOLN: 25.0000 mg | INTRAMUSCULAR | Status: DC | PRN

## 2012-08-22 MED ORDER — SIMETHICONE 80 MG PO CHEW
80.0000 mg | CHEWABLE_TABLET | ORAL | Status: DC | PRN
  Administered 2012-08-23: 80 mg via ORAL

## 2012-08-22 MED ORDER — ALBUTEROL SULFATE HFA 108 (90 BASE) MCG/ACT IN AERS
2.0000 | INHALATION_SPRAY | Freq: Four times a day (QID) | RESPIRATORY_TRACT | Status: DC | PRN
  Filled 2012-08-22: qty 6.7

## 2012-08-22 MED ORDER — METHYLERGONOVINE MALEATE 0.2 MG/ML IJ SOLN: 0.2000 mg | INTRAMUSCULAR | Status: DC | PRN

## 2012-08-22 MED ORDER — IBUPROFEN 600 MG PO TABS
600.0000 mg | ORAL_TABLET | Freq: Four times a day (QID) | ORAL | Status: DC
  Administered 2012-08-22 – 2012-08-24 (×8): 600 mg via ORAL
  Filled 2012-08-22 (×4): qty 1

## 2012-08-22 MED ORDER — DIPHENHYDRAMINE HCL 50 MG/ML IJ SOLN: 12.5000 mg | INTRAMUSCULAR | Status: DC | PRN

## 2012-08-22 NOTE — Anesthesia Postprocedure Evaluation (Signed)
  Anesthesia Post-op Note  Patient: [name]  Procedure(s) Performed: Procedure(s) (LRB) with comments: CESAREAN SECTION (N/A)  Patient Location: Mother/Baby  Anesthesia Type: Epidural  Level of Consciousness: awake, alert  and oriented  Airway and Oxygen Therapy: Patient Spontanous Breathing  Post-op Pain: none  Post-op Assessment: Post-op Vital signs reviewed, Patient's Cardiovascular Status Stable, No headache, No backache, No residual numbness and No residual motor weakness  Post-op Vital Signs: Reviewed and stable  Complications: No apparent anesthesia complications

## 2012-08-22 NOTE — Progress Notes (Signed)
UR Chart review completed.  

## 2012-08-22 NOTE — Progress Notes (Addendum)
Patient c/o itching unrelieved by po benadryl at 2015. Dr. Dareen Piano called at 2155 and asked RN to defer to anesthesia. Dr. Rodman Pickle called at 2225 (delay due to C/S ongoing in OR)  and instructed RN to give Nubain for relief. Will continue to monitor patient.

## 2012-08-22 NOTE — Progress Notes (Signed)
POD#1  Pt without c/o. Lochia-mild. Pain control-adequate. Tolerating diet. VSSAF IMP/ stable Plan/ routine care.

## 2012-08-23 NOTE — Progress Notes (Signed)
POD#2 Pt doing well. States that she is not ready for discharge. Plan/ Will discharge to home in am.

## 2012-08-23 NOTE — Clinical Social Work Note (Signed)

## 2012-08-24 MED ORDER — OXYCODONE-ACETAMINOPHEN 5-325 MG PO TABS: 1.0000 | ORAL_TABLET | ORAL | Status: AC | PRN

## 2012-08-24 NOTE — Discharge Summary (Signed)
Obstetric Discharge Summary Reason for Admission: onset of labor Prenatal Procedures: ultrasound Intrapartum Procedures: cesarean: low cervical, transverse Postpartum Procedures: none Complications-Operative and Postpartum: Failure to Progress Hemoglobin  Date Value Range Status  08/22/2012 10.9* 12.0 - 15.0 g/dL Final     HCT  Date Value Range Status  08/22/2012 33.1* 36.0 - 46.0 % Final    Physical Exam:  General: alert Lochia: appropriate Uterine Fundus: firm Incision: healing well DVT Evaluation: No evidence of DVT seen on physical exam.  Discharge Diagnoses: Term Pregnancy-delivered  Discharge Information: Date: 08/24/2012 Activity: pelvic rest Diet: routine Medications: PNV, Ibuprofen and Percocet Condition: stable Instructions: refer to practice specific booklet Discharge to: home Follow-up Information    Follow up with HORVATH,MICHELLE A, MD. Schedule an appointment as soon as possible for a visit in 4 weeks.   Contact information:   [address] Dorothy[address] Gibbs [place] [address] [phone]          Newborn Data: Live born female  Birth Weight: 8 lb 5.9 oz (3795 g) APGAR: 8,   Home with mother.  Aldrin Engelhard E 08/24/2012, 10:01 AM

## 2012-08-24 NOTE — Progress Notes (Signed)
PPD#3 Pt is doing well. Ready for discharge.

## 2012-08-25 LAB — TYPE AND SCREEN
Antibody Screen: NEGATIVE
Unit division: 0

## 2012-08-26 ENCOUNTER — Encounter (HOSPITAL_COMMUNITY): Admission: RE | Payer: Self-pay | Source: Ambulatory Visit

## 2012-08-26 ENCOUNTER — Inpatient Hospital Stay (HOSPITAL_COMMUNITY)
Admission: RE | Admit: 2012-08-26 | Payer: Medicaid Other | Source: Ambulatory Visit | Admitting: Obstetrics and Gynecology

## 2012-08-26 SURGERY — Surgical Case
Anesthesia: Regional

## 2014-07-20 ENCOUNTER — Inpatient Hospital Stay (HOSPITAL_COMMUNITY): Payer: PRIVATE HEALTH INSURANCE

## 2014-07-20 ENCOUNTER — Inpatient Hospital Stay (HOSPITAL_COMMUNITY)
Admission: AD | Admit: 2014-07-20 | Discharge: 2014-07-20 | Disposition: A | Discharge status: Home / Self Care | Payer: PRIVATE HEALTH INSURANCE | Source: Ambulatory Visit | Attending: Obstetrics & Gynecology | Admitting: Obstetrics & Gynecology

## 2014-07-20 ENCOUNTER — Encounter (HOSPITAL_COMMUNITY): Payer: Self-pay | Admitting: *Deleted

## 2014-07-20 DIAGNOSIS — N926 Irregular menstruation, unspecified: Secondary | ICD-10-CM | POA: Insufficient documentation

## 2014-07-20 DIAGNOSIS — N949 Unspecified condition associated with female genital organs and menstrual cycle: Secondary | ICD-10-CM | POA: Insufficient documentation

## 2014-07-20 DIAGNOSIS — B3731 Acute candidiasis of vulva and vagina: Secondary | ICD-10-CM | POA: Diagnosis not present

## 2014-07-20 DIAGNOSIS — B373 Candidiasis of vulva and vagina: Secondary | ICD-10-CM

## 2014-07-20 DIAGNOSIS — Z87891 Personal history of nicotine dependence: Secondary | ICD-10-CM | POA: Diagnosis not present

## 2014-07-20 LAB — URINALYSIS, ROUTINE W REFLEX MICROSCOPIC
Bilirubin Urine: NEGATIVE
Glucose, UA: NEGATIVE mg/dL
HGB URINE DIPSTICK: NEGATIVE
Ketones, ur: 15 mg/dL — AB
LEUKOCYTES UA: NEGATIVE
NITRITE: NEGATIVE
PROTEIN: NEGATIVE mg/dL
SPECIFIC GRAVITY, URINE: 1.02 (ref 1.005–1.030)
UROBILINOGEN UA: 2 mg/dL — AB (ref 0.0–1.0)
pH: 6.5 (ref 5.0–8.0)

## 2014-07-20 LAB — WET PREP, GENITAL
Trich, Wet Prep: NONE SEEN
YEAST WET PREP: NONE SEEN

## 2014-07-20 LAB — CBC
HCT: 42.4 % (ref 36.0–46.0)
Hemoglobin: 14.3 g/dL (ref 12.0–15.0)
MCH: 27.5 pg (ref 26.0–34.0)
MCHC: 33.7 g/dL (ref 30.0–36.0)
MCV: 81.5 fL (ref 78.0–100.0)
PLATELETS: 300 10*3/uL (ref 150–400)
RBC: 5.2 MIL/uL — AB (ref 3.87–5.11)
RDW: 13.8 % (ref 11.5–15.5)
WBC: 6.4 10*3/uL (ref 4.0–10.5)

## 2014-07-20 LAB — POCT PREGNANCY, URINE: PREG TEST UR: NEGATIVE

## 2014-07-20 MED ORDER — NYSTATIN 100000 UNIT/GM EX CREA: TOPICAL_CREAM | CUTANEOUS | Status: DC

## 2014-07-20 MED ORDER — TRIAMCINOLONE ACETONIDE 0.1 % EX CREA: 1.0000 "application " | TOPICAL_CREAM | Freq: Two times a day (BID) | CUTANEOUS | Status: DC

## 2014-07-20 NOTE — MAU Provider Note (Signed)
History     CSN: [id number]  Arrival date and time: 07/20/14 1831   First Provider Initiated Contact with Patient 07/20/14 2242      Chief Complaint  Patient presents with  . Vaginal Pain  . Vaginal Bleeding   HPI [name] is a 28 y.o. [phone] who presents to MAU today with complaint of vaginal pain and irritation. The patient states that she has occasional irregular periods. She has had a Mirena IUD x 2 years. She states that the vaginal pain started 2 days ago. She noted a thick, white discharge and used monistat OTC. She states that the discharge went away after using the monistat, but the pain became worse, especially today. She states that she became more concerned when the bleeding started earlier today as well. She states bleeding is light. She states last intercourse was last night. She denies fever, N/V/D or constipation.   OB History   Grav Para Term Preterm Abortions TAB SAB Ect Mult Living   2 2 2       2       Past Medical History  Diagnosis Date  . Asthma   . Urinary tract infection     Past Surgical History  Procedure Laterality Date  . Cesarean section    . Cesarean section  08/21/2012    Procedure: CESAREAN SECTION;  Surgeon: 10/21/2012, MD;  Location: [facility];  Service: Obstetrics;  Laterality: N/A;    Family History  Problem Relation Age of Onset  . Other Neg Hx     History  Substance Use Topics  . Smoking status: Former Smoker    Types: Cigarettes  . Smokeless tobacco: Never Used  . Alcohol Use: No     Comment: not with preg    Allergies: No Known Allergies  No prescriptions prior to admission    Review of Systems  Constitutional: Negative for fever and malaise/fatigue.  Gastrointestinal: Negative for nausea, vomiting, abdominal pain, diarrhea and constipation.  Genitourinary: Positive for dysuria. Negative for urgency and frequency.       + vaginal discharge, bleeding   Physical Exam   Blood pressure 117/79, pulse 71,  temperature 98.2 F (36.8 C), temperature source Oral, resp. rate 16, last menstrual period 06/24/2014, unknown if currently breastfeeding.  Physical Exam  Constitutional: She is oriented to person, place, and time. She appears well-developed and well-nourished. No distress.  HENT:  Head: Normocephalic and atraumatic.  Cardiovascular: Normal rate.   Respiratory: Effort normal.  GI: Soft. She exhibits no distension and no mass. There is no tenderness. There is no rebound and no guarding.  Genitourinary: There is rash on the right labia. There is no tenderness on the right labia. There is rash on the left labia. There is no tenderness on the left labia. Uterus is not enlarged and not tender. Cervix exhibits no motion tenderness, no discharge and no friability. Right adnexum displays no mass and no tenderness. Left adnexum displays no mass and no tenderness. No bleeding around the vagina. No vaginal discharge found.  Neurological: She is alert and oriented to person, place, and time.  Skin: Skin is warm and dry. No erythema.  Psychiatric: She has a normal mood and affect.   Results for orders placed during the hospital encounter of 07/20/14 (from the past 24 hour(s))  URINALYSIS, ROUTINE W REFLEX MICROSCOPIC     Status: Abnormal   Collection Time    07/20/14  7:00 PM      Result Value  Ref Range   Color, Urine YELLOW  YELLOW   APPearance CLEAR  CLEAR   Specific Gravity, Urine 1.020  1.005 - 1.030   pH 6.5  5.0 - 8.0   Glucose, UA NEGATIVE  NEGATIVE mg/dL   Hgb urine dipstick NEGATIVE  NEGATIVE   Bilirubin Urine NEGATIVE  NEGATIVE   Ketones, ur 15 (*) NEGATIVE mg/dL   Protein, ur NEGATIVE  NEGATIVE mg/dL   Urobilinogen, UA 2.0 (*) 0.0 - 1.0 mg/dL   Nitrite NEGATIVE  NEGATIVE   Leukocytes, UA NEGATIVE  NEGATIVE  POCT PREGNANCY, URINE     Status: None   Collection Time    07/20/14  7:08 PM      Result Value Ref Range   Preg Test, Ur NEGATIVE  NEGATIVE  CBC     Status: Abnormal    Collection Time    07/20/14  7:46 PM      Result Value Ref Range   WBC 6.4  4.0 - 10.5 K/uL   RBC 5.20 (*) 3.87 - 5.11 MIL/uL   Hemoglobin 14.3  12.0 - 15.0 g/dL   HCT 26.9  48.5 - 46.2 %   MCV 81.5  78.0 - 100.0 fL   MCH 27.5  26.0 - 34.0 pg   MCHC 33.7  30.0 - 36.0 g/dL   RDW 70.3  50.0 - 93.8 %   Platelets 300  150 - 400 K/uL  WET PREP, GENITAL     Status: Abnormal   Collection Time    07/20/14 11:20 PM      Result Value Ref Range   Yeast Wet Prep HPF POC NONE SEEN  NONE SEEN   Trich, Wet Prep NONE SEEN  NONE SEEN   Clue Cells Wet Prep HPF POC FEW (*) NONE SEEN   WBC, Wet Prep HPF POC FEW (*) NONE SEEN   US Transvaginal Non-ob  07/20/2014   CLINICAL DATA:  Pain, bleeding with IUD placement.  EXAM: TRANSABDOMINAL ULTRASOUND OF PELVIS  TECHNIQUE: Transabdominal ultrasound examination of the pelvis was performed including evaluation of the uterus, ovaries, adnexal regions, and pelvic cul-de-sac.  COMPARISON:  None.  FINDINGS: Uterus  Measurements: 7.5 x 3.8 x 4.6 cm. No fibroids or other mass visualized. An IUD was present within the endometrial canal on appear to be in good position.  Endometrium  Thickness: 4.5 mm.  No focal abnormality visualized.  Right ovary  Measurements: 2.9 x 1.4 x 1.6 cm. Normal appearance/no adnexal mass.  Left ovary  Measurements: 3.2 x 1.9 x 1.9 cm. Normal appearance/no adnexal mass.  Other findings:  No free fluid  IMPRESSION: 1. IUD in good position within the uterus. 2. No other acute abnormality within the pelvis.   Electronically Signed   By: Rise Mu M.D.   On: 07/20/2014 20:38   US Pelvis Complete  07/20/2014   CLINICAL DATA:  Pain, bleeding with IUD placement.  EXAM: TRANSABDOMINAL ULTRASOUND OF PELVIS  TECHNIQUE: Transabdominal ultrasound examination of the pelvis was performed including evaluation of the uterus, ovaries, adnexal regions, and pelvic cul-de-sac.  COMPARISON:  None.  FINDINGS: Uterus  Measurements: 7.5 x 3.8 x 4.6 cm. No  fibroids or other mass visualized. An IUD was present within the endometrial canal on appear to be in good position.  Endometrium  Thickness: 4.5 mm.  No focal abnormality visualized.  Right ovary  Measurements: 2.9 x 1.4 x 1.6 cm. Normal appearance/no adnexal mass.  Left ovary  Measurements: 3.2 x 1.9 x 1.9 cm. Normal appearance/no adnexal  mass.  Other findings:  No free fluid  IMPRESSION: 1. IUD in good position within the uterus. 2. No other acute abnormality within the pelvis.   Electronically Signed   By: Rise Mu M.D.   On: 07/20/2014 20:38     MAU Course  Procedures None  MDM UPT - negative UA, wet prep, GC/Chlamydia, CBC and [place] today  Assessment and Plan  A: Yeast vulvovaginitis, clinical  P: Discharge home Rx for Nystatin and Triamcinolone given to patient GC/Chlamydia pending Patient may return to MAU as needed or if her condition were to change or worsen   Freddi Starr, PA-C  07/21/2014, 4:25 AM

## 2014-07-20 NOTE — Discharge Instructions (Signed)

## 2014-07-20 NOTE — MAU Note (Signed)
IUD placed 1 1/2 years ago, had to have previous IUD removed.  Intense vaginal pain for the past 3 days, also some vaginal swelling.  Started bleeding today, also some cramping.

## 2014-07-21 LAB — GC/CHLAMYDIA PROBE AMP
CT Probe RNA: NEGATIVE
GC Probe RNA: NEGATIVE

## 2014-07-21 NOTE — MAU Provider Note (Signed)
Attestation of Attending Supervision of Advanced Practitioner (CNM/NP): Evaluation and management procedures were performed by the Advanced Practitioner under my supervision and collaboration.  I have reviewed the Advanced Practitioner's note and chart, and I agree with the management and plan.  HARRAWAY-SMITH, Naylin Burkle 4:50 AM

## 2014-10-11 ENCOUNTER — Encounter (HOSPITAL_COMMUNITY): Payer: Self-pay | Admitting: *Deleted

## 2014-12-18 IMAGING — US US TRANSVAGINAL NON-OB
1 series · 14 of 25 positions shown · non-contrast
Comparison: None.

CLINICAL DATA: Pain, bleeding with IUD placement.

EXAM:
TRANSABDOMINAL ULTRASOUND OF PELVIS
TECHNIQUE: Transabdominal ultrasound examination of the pelvis was performed
including evaluation of the uterus, ovaries, adnexal regions, and
pelvic cul-de-sac.

[Series 1: us pelvis complete · 14 of 49 slices shown]
[im 1/49]
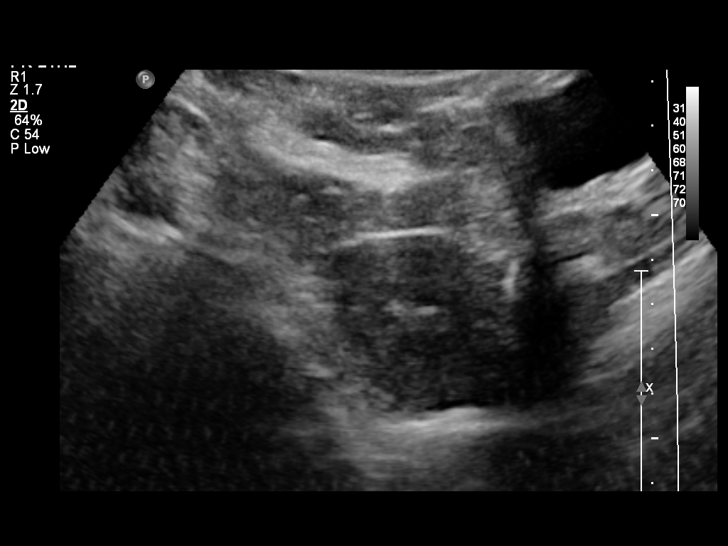
[im 5/49]
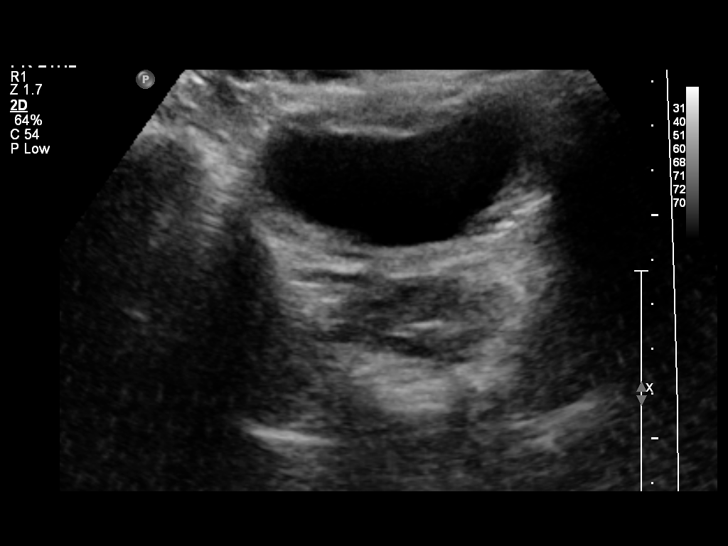
[im 9/49]
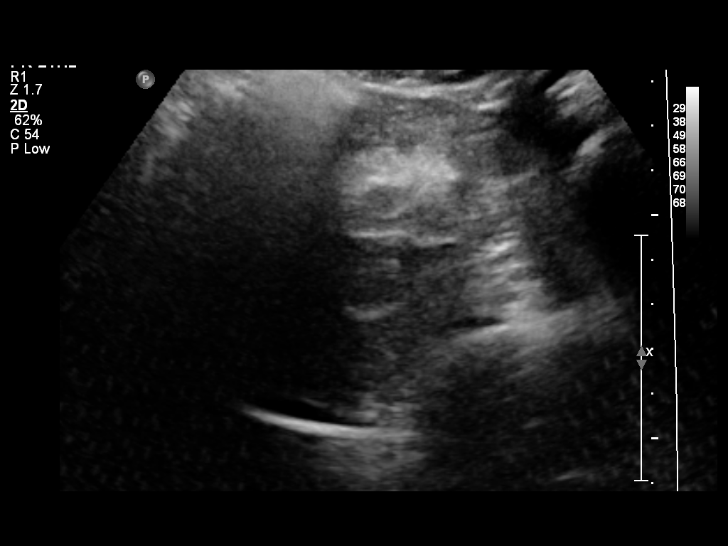
[im 13/49]
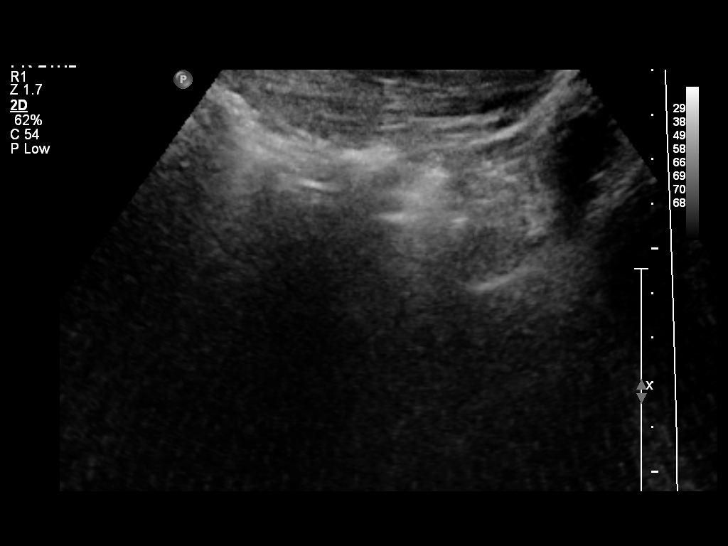
[im 17/49]
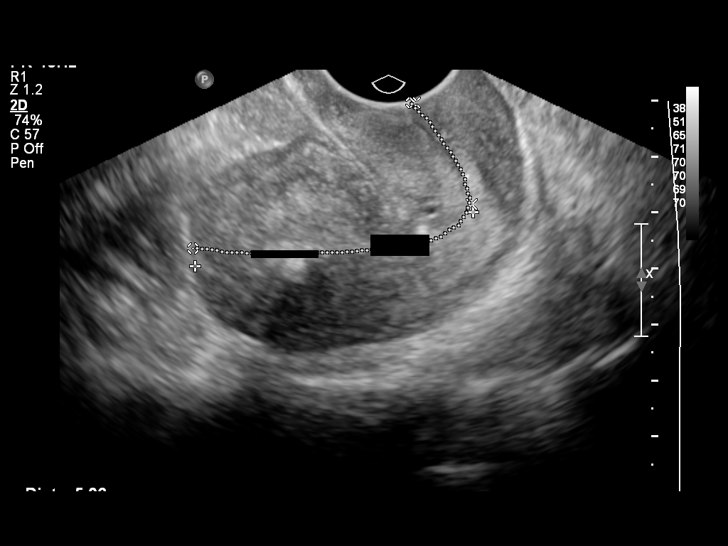
[im 19/49]
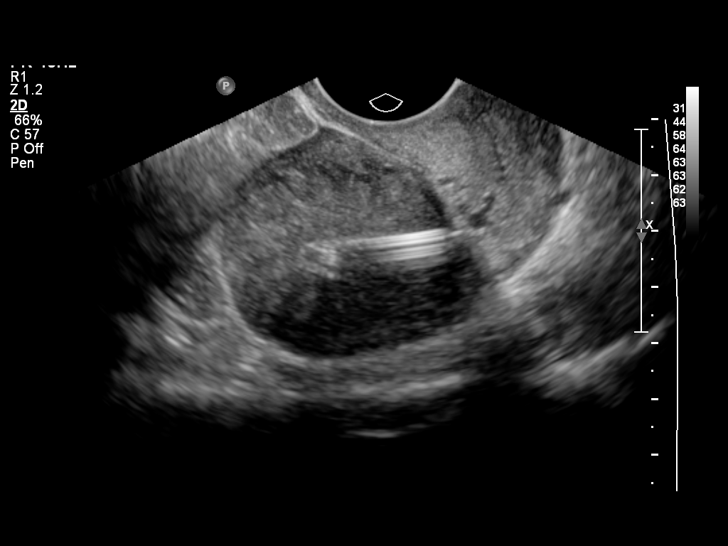
[im 23/49]
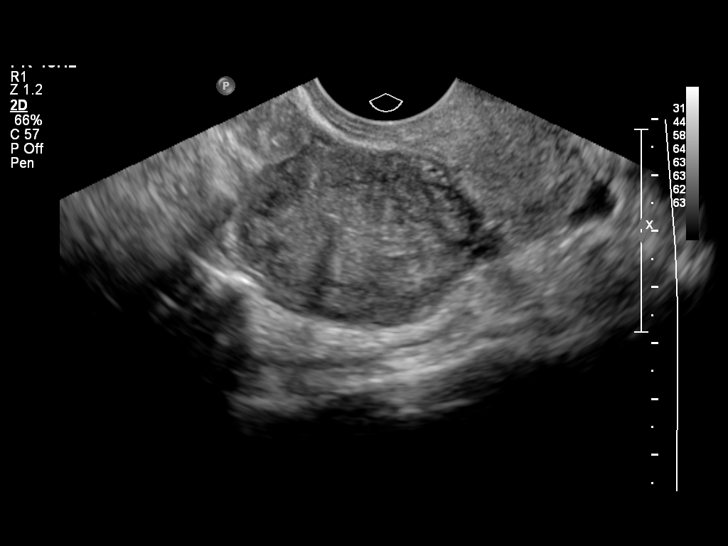
[im 27/49]
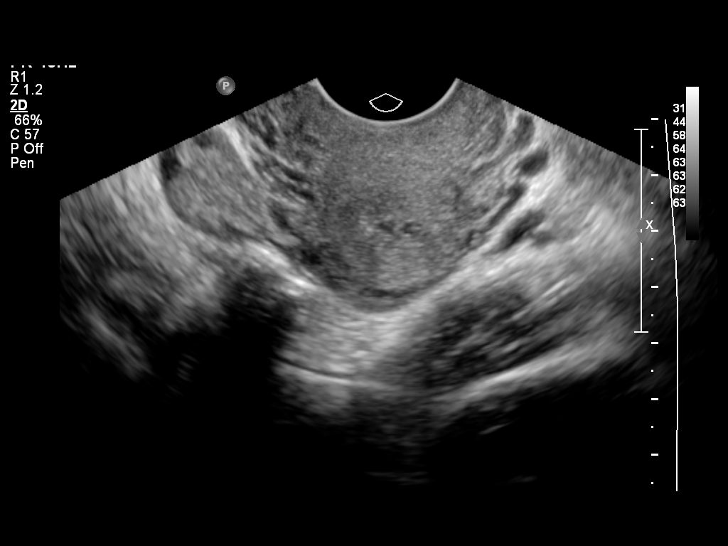
[im 31/49]
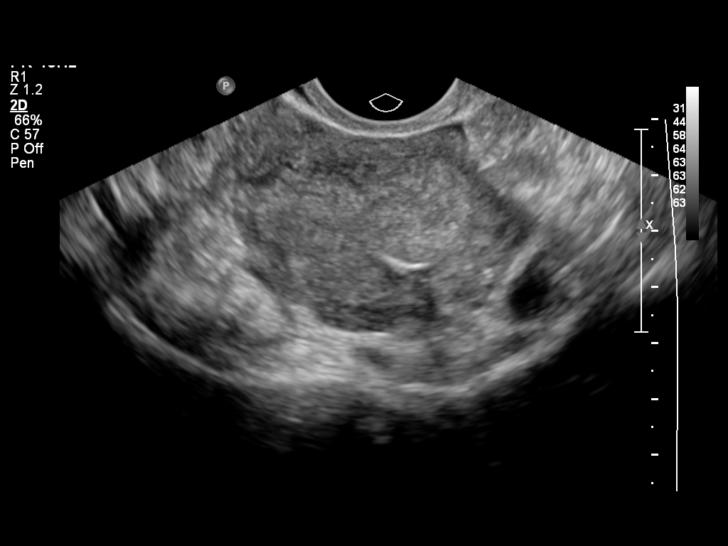
[im 33/49]
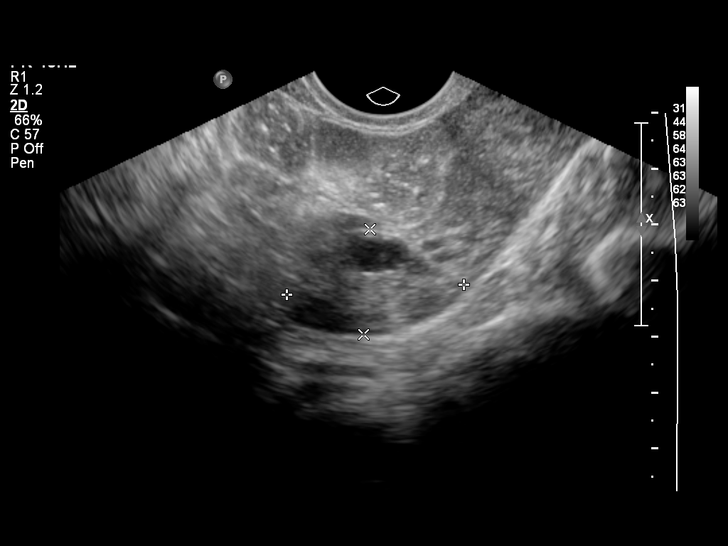
[im 37/49]
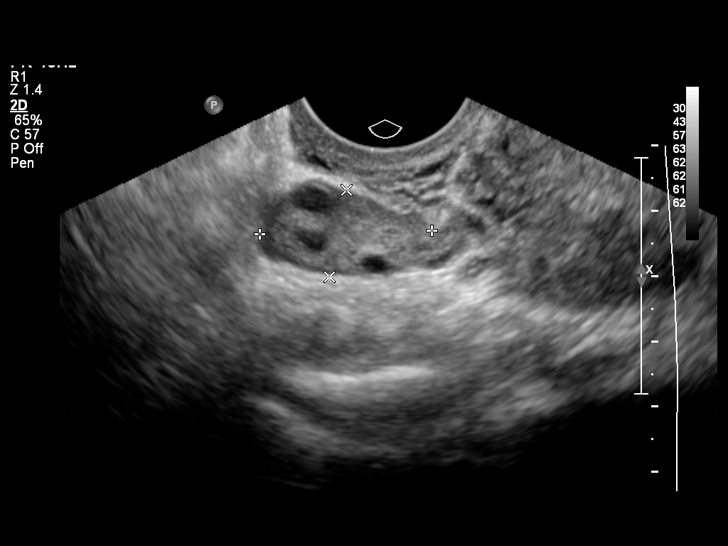
[im 41/49]
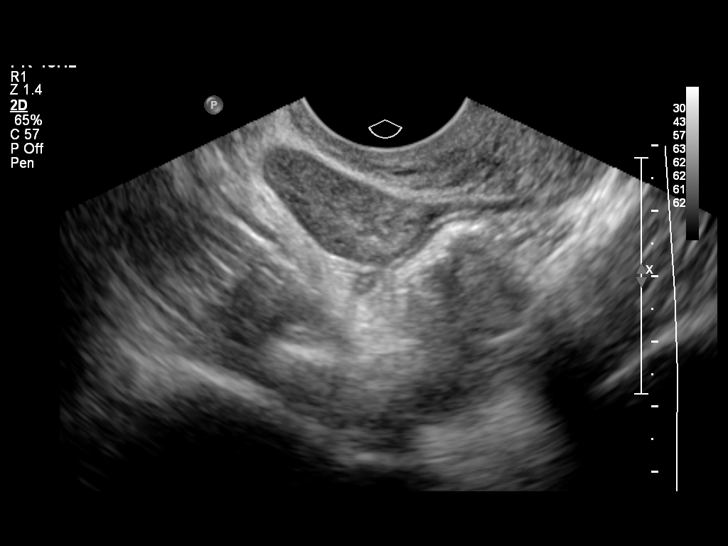
[im 45/49]
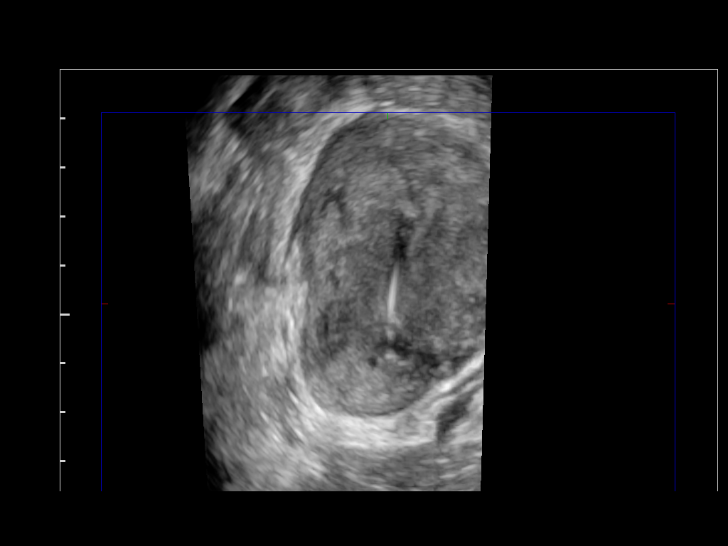
[im 49/49]
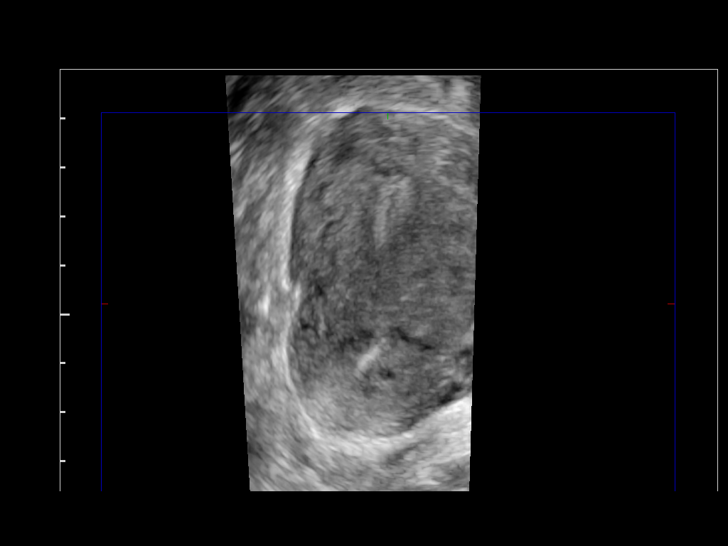

[14 of 25 positions shown; findings below may reference images not displayed]

FINDINGS: Uterus

Measurements: 7.5 x 3.8 x 4.6 cm. No fibroids or other mass
visualized. An IUD was present within the endometrial canal on
appear to be in good position.

Endometrium

Thickness: 4.5 mm.  No focal abnormality visualized.

Right ovary

Measurements: 2.9 x 1.4 x 1.6 cm. Normal appearance/no adnexal mass.

Left ovary

Measurements: 3.2 x 1.9 x 1.9 cm. Normal appearance/no adnexal mass.

Other findings:  No free fluid
IMPRESSION: 1. IUD in good position within the uterus.
2. No other acute abnormality within the pelvis.

## 2015-05-23 ENCOUNTER — Emergency Department (HOSPITAL_COMMUNITY)
Admission: EM | Admit: 2015-05-23 | Discharge: 2015-05-23 | Disposition: A | Discharge status: Home / Self Care | Payer: PRIVATE HEALTH INSURANCE | Source: Home / Self Care | Attending: Family Medicine | Admitting: Family Medicine

## 2015-05-23 ENCOUNTER — Encounter (HOSPITAL_COMMUNITY): Payer: Self-pay | Admitting: *Deleted

## 2015-05-23 ENCOUNTER — Other Ambulatory Visit (HOSPITAL_COMMUNITY)
Admission: RE | Admit: 2015-05-23 | Discharge: 2015-05-23 | Disposition: A | Discharge status: Home / Self Care | Payer: Medicaid Other | Source: Ambulatory Visit | Attending: Family Medicine | Admitting: Family Medicine

## 2015-05-23 DIAGNOSIS — A499 Bacterial infection, unspecified: Secondary | ICD-10-CM | POA: Diagnosis not present

## 2015-05-23 DIAGNOSIS — Z113 Encounter for screening for infections with a predominantly sexual mode of transmission: Secondary | ICD-10-CM | POA: Diagnosis not present

## 2015-05-23 DIAGNOSIS — N760 Acute vaginitis: Secondary | ICD-10-CM | POA: Insufficient documentation

## 2015-05-23 DIAGNOSIS — B9689 Other specified bacterial agents as the cause of diseases classified elsewhere: Secondary | ICD-10-CM

## 2015-05-23 LAB — POCT URINALYSIS DIP (DEVICE)
GLUCOSE, UA: NEGATIVE mg/dL
Hgb urine dipstick: NEGATIVE
LEUKOCYTES UA: NEGATIVE
Nitrite: NEGATIVE
PROTEIN: NEGATIVE mg/dL
UROBILINOGEN UA: 0.2 mg/dL (ref 0.0–1.0)
pH: 6 (ref 5.0–8.0)

## 2015-05-23 LAB — POCT PREGNANCY, URINE: Preg Test, Ur: NEGATIVE

## 2015-05-23 MED ORDER — METRONIDAZOLE 0.75 % VA GEL: 1.0000 | Freq: Every day | VAGINAL | Status: DC

## 2015-05-23 NOTE — ED Provider Notes (Signed)
CSN: [id number]     Arrival date & time 05/23/15  1549 History   First MD Initiated Contact with Patient 05/23/15 1627     Chief Complaint  Patient presents with  . Vaginal Discharge   (Consider location/radiation/quality/duration/timing/severity/associated sxs/prior Treatment) Patient is a 29 y.o. female presenting with vaginal discharge. The history is provided by the patient.  Vaginal Discharge Quality:  Malodorous and mucoid Severity:  Mild Onset quality:  Gradual Duration:  10 months Progression:  Unchanged Chronicity:  Chronic Relieved by:  None tried Worsened by:  Nothing tried Ineffective treatments:  None tried Associated symptoms: abdominal pain and dysuria   Associated symptoms: no fever, no genital lesions, no nausea, no vaginal itching and no vomiting   Risk factors comment:  Has merina iud in place   Past Medical History  Diagnosis Date  . Asthma   . Urinary tract infection    Past Surgical History  Procedure Laterality Date  . Cesarean section    . Cesarean section  08/21/2012    Procedure: CESAREAN SECTION;  Surgeon: Loney Laurence, MD;  Location: [facility];  Service: Obstetrics;  Laterality: N/A;   Family History  Problem Relation Age of Onset  . Other Neg Hx    History  Substance Use Topics  . Smoking status: Former Smoker    Types: Cigarettes  . Smokeless tobacco: Never Used  . Alcohol Use: No     Comment: not with preg   OB History    Gravida Para Term Preterm AB TAB SAB Ectopic Multiple Living   2 2 2       2      Review of Systems  Constitutional: Negative.  Negative for fever.  Gastrointestinal: Positive for abdominal pain. Negative for nausea and vomiting.  Genitourinary: Positive for dysuria, vaginal discharge, menstrual problem and pelvic pain. Negative for vaginal bleeding and vaginal pain.    Allergies  Review of patient's allergies indicates no known allergies.  Home Medications   Prior to Admission medications   Medication  Sig Start Date End Date Taking? Authorizing Provider  albuterol (PROVENTIL HFA;VENTOLIN HFA) 108 (90 BASE) MCG/ACT inhaler Inhale 2 puffs into the lungs every 6 (six) hours as needed. Asthma attacks, emergency use only.    Historical Provider, MD  metroNIDAZOLE (METROGEL VAGINAL) 0.75 % vaginal gel Place 1 Applicatorful vaginally at bedtime. For 5 nights 05/23/15   05/25/15, MD  nystatin cream (MYCOSTATIN) Apply to affected area 2 times daily 07/20/14   09/19/14, PA-C  Prenatal Vit-Fe Fumarate-FA (PRENATAL MULTIVITAMIN) TABS Take 1 tablet by mouth at bedtime.    Historical Provider, MD  triamcinolone cream (KENALOG) 0.1 % Apply 1 application topically 2 (two) times daily. 07/20/14   09/19/14, PA-C   BP 104/66 mmHg  Pulse 108  Temp(Src) 98.9 F (37.2 C) (Oral)  Resp 16  SpO2 100% Physical Exam  Constitutional: She is oriented to person, place, and time. She appears well-developed and well-nourished.  Abdominal: Soft. Bowel sounds are normal. She exhibits no distension and no mass. There is no tenderness. There is no rebound and no guarding.  Genitourinary: Uterus normal.    Pelvic exam was performed with patient supine. There is no tenderness or lesion on the right labia. There is no tenderness or lesion on the left labia. Cervix exhibits no motion tenderness, no discharge and no friability. Right adnexum displays no tenderness. Left adnexum displays no tenderness. No erythema or tenderness in the vagina. There is a foreign  body in the vagina. Vaginal discharge found.  Neurological: She is alert and oriented to person, place, and time.  Skin: Skin is warm and dry.  Nursing note and vitals reviewed.   ED Course  Procedures (including critical care time) Labs Review Labs Reviewed  POCT URINALYSIS DIP (DEVICE) - Abnormal; Notable for the following:    Bilirubin Urine SMALL (*)    Ketones, ur TRACE (*)    All other components within normal limits  HIV ANTIBODY (ROUTINE  TESTING)  POCT PREGNANCY, URINE  CERVICOVAGINAL ANCILLARY ONLY   U/a and  upreg neg. Imaging Review No results found.   MDM   1. BV (bacterial vaginosis)        Linna Hoff, MD 05/23/15 1719

## 2015-05-23 NOTE — Discharge Instructions (Signed)
We will call with positive test results and treat as indicated  °

## 2015-05-23 NOTE — ED Notes (Signed)
Pt  Reports   Symptoms  Of  lower  Abdominal  Pain  As   Well as    Vaginal   Discharge    With  Symptoms          For  sev  Months       Pt   Ambulated  To  Room  With a  Slow  Steady  Gait     Pt     Reports  The     Odor  Is  Foul

## 2015-05-24 LAB — CERVICOVAGINAL ANCILLARY ONLY
Chlamydia: NEGATIVE
Neisseria Gonorrhea: NEGATIVE

## 2015-05-24 LAB — HIV ANTIBODY (ROUTINE TESTING W REFLEX): HIV SCREEN 4TH GENERATION: NONREACTIVE

## 2015-05-24 NOTE — ED Notes (Signed)
Final reports of STD, HIV screenings negative; waiting for wet prep report

## 2015-05-25 LAB — CERVICOVAGINAL ANCILLARY ONLY: Wet Prep (BD Affirm): POSITIVE — AB

## 2015-05-26 NOTE — ED Notes (Signed)
Final report of wet prep positive for gardnerella. Treatment adequate w metrogel Rx

## 2015-12-22 ENCOUNTER — Encounter (HOSPITAL_COMMUNITY): Payer: Self-pay | Admitting: *Deleted

## 2015-12-22 ENCOUNTER — Emergency Department (HOSPITAL_COMMUNITY)
Admission: EM | Admit: 2015-12-22 | Discharge: 2015-12-22 | Disposition: A | Discharge status: Home / Self Care | Payer: Medicaid Other | Attending: Emergency Medicine | Admitting: Emergency Medicine

## 2015-12-22 DIAGNOSIS — Z87891 Personal history of nicotine dependence: Secondary | ICD-10-CM | POA: Diagnosis not present

## 2015-12-22 DIAGNOSIS — K0889 Other specified disorders of teeth and supporting structures: Secondary | ICD-10-CM | POA: Insufficient documentation

## 2015-12-22 DIAGNOSIS — J45909 Unspecified asthma, uncomplicated: Secondary | ICD-10-CM | POA: Diagnosis not present

## 2015-12-22 DIAGNOSIS — Z7952 Long term (current) use of systemic steroids: Secondary | ICD-10-CM | POA: Insufficient documentation

## 2015-12-22 DIAGNOSIS — Z79899 Other long term (current) drug therapy: Secondary | ICD-10-CM | POA: Insufficient documentation

## 2015-12-22 DIAGNOSIS — Z87440 Personal history of urinary (tract) infections: Secondary | ICD-10-CM | POA: Diagnosis not present

## 2015-12-22 MED ORDER — ACETAMINOPHEN-CODEINE #3 300-30 MG PO TABS: 1.0000 | ORAL_TABLET | Freq: Four times a day (QID) | ORAL | Status: DC | PRN

## 2015-12-22 NOTE — Discharge Instructions (Signed)
Please read and follow all provided instructions.  Your diagnoses today include:  1. Pain, dental    The exam and treatment you received today has been provided on an emergency basis only. This is not a substitute for complete medical or dental care.  Tests performed today include:  Vital signs. See below for your results today.   Medications prescribed:   Take any prescribed medications only as directed.  Home care instructions:  Follow any educational materials contained in this packet.  Follow-up instructions: Please follow-up with your dentist for further evaluation of your symptoms.   Dental Assistance: See below for dental referrals  Return instructions:   Please return to the Emergency Department if you experience worsening symptoms.  Please return if you develop a fever, you develop more swelling in your face or neck, you have trouble breathing or swallowing food.  Please return if you have any other emergent concerns.  Additional Information:  Your vital signs today were: BP 119/69 mmHg   Pulse 83   Temp(Src) 97.6 F (36.4 C) (Oral)   Resp 20   SpO2 100% If your blood pressure (BP) was elevated above 135/85 this visit, please have this repeated by your doctor within one month. -------------- Dental Care: Organization         Address  Phone  Notes  [facility] Department of [facility] [address] [place], [place] [phone] Accepts children up to age 19 who are enrolled in [place] or [place] Health Choice; pregnant women with a Medicaid card; and children who have applied for Medicaid or [place] Health Choice, but were declined, whose parents can pay a reduced fee at time of service.  [facility] Department of [facility]  [address], [place] [phone] Accepts children up to age 54 who are enrolled in [place] or [place] Health Choice; pregnant women with a Medicaid card; and children who have applied for  Medicaid or [place] Health Choice, but were declined, whose parents can pay a reduced fee at time of service.  [facility]  [address] [place], [place] [phone] Patients are seen by appointment only. Walk-ins are not accepted. [facility] will see patients 59 years of age and older. Monday - Tuesday (8am-5pm) Most Wednesdays (8:30-5pm) $30 per visit, cash only  [facility]  [address], [facility] [phone] Patients are seen by appointment only. Walk-ins are not accepted. [facility] will see patients 64 years of age and older. One Wednesday Evening (Monthly: Volunteer Based).  $30 per visit, cash only  [employer]  [phone] for adults; Children under age 74, call Graduate Pediatric Dentistry at [phone]. Children aged 36-14, please call [phone] to request a pediatric application.  Dental services are provided in all areas of dental care including fillings, crowns and bridges, complete and partial dentures, implants, gum treatment, root canals, and extractions. Preventive care is also provided. Treatment is provided to both adults and children. Patients are selected via a lottery and there is often a waiting list.   [facility] [address] [place]  [phone] [email or web address]   [facility] [address] [place], [place] [phone], Ext. [phone] Second and Fourth Thursday of each month, opens at 6:30 AM; Clinic ends at 9 AM.  Patients are seen on a first-come first-served basis, and a limited number are seen during each  clinic.   [facility]  [address] [place], [place] [phone]   Eligibility Requirements You must have lived in [place], [place], or [place] counties for at least the last three months.   You cannot be eligible for state or federal sponsored [place], including [employer], [place],  or [employer].   You generally cannot be eligible for healthcare insurance through your employer.    How to apply: Eligibility screenings are held every Tuesday and Wednesday afternoon from 1:00 pm until 4:00 pm. You do not need an appointment for the interview!  [facility] [address], [place], [place] [phone]   [facility] Health Department  [phone]   [facility] Health Department  [phone]   [facility] Department  [phone]

## 2015-12-22 NOTE — ED Notes (Signed)
Pt reports rt sided dental pain and swelling. Pt was scheduled to have the tooth removed this week but had to reschedule and was told to come here if needed.

## 2015-12-22 NOTE — ED Provider Notes (Signed)
CSN: [id number]     Arrival date & time 12/22/15  7209 History  By signing my name below, I, Jarvis Morgan, attest that this documentation has been prepared under the direction and in the presence of Audry Pili, PA-C Electronically Signed: Jarvis Morgan, ED Scribe. 12/22/2015. 9:23 AM.    Chief Complaint  Patient presents with  . Dental Pain   The history is provided by the patient. No language interpreter was used.    HPI Comments: [name] is a 30 y.o. female who presents to the Emergency Department complaining of constant, moderate, right sided dental pain for 4 days. She rates the pain as a 6/10 in severity. She reports associated right sided facial swelling. Pt has been taking Aleve every 6 hours with no significant relief. She states the pain is exacerbated with eating and palpation of the tooth. Pt notes she was scheduled to have a tooth removed by her dentist this week and had to reschedule. She states she called her dentist to report the worsening pain and they told her to come to the ER for evaluation. She has another appt with her dentist next week. Pt denies any h/o DM. She denies any daily medication use. Pt denies any fevers, chills, nausea, vomiting, trouble swallowing, trouble breathing, voice changes, or other associated symptoms.  Past Medical History  Diagnosis Date  . Asthma   . Urinary tract infection    Past Surgical History  Procedure Laterality Date  . Cesarean section    . Cesarean section  08/21/2012    Procedure: CESAREAN SECTION;  Surgeon: Loney Laurence, MD;  Location: [facility];  Service: Obstetrics;  Laterality: N/A;   Family History  Problem Relation Age of Onset  . Other Neg Hx    Social History  Substance Use Topics  . Smoking status: Former Smoker    Types: Cigarettes  . Smokeless tobacco: Never Used  . Alcohol Use: No     Comment: not with preg   OB History    Gravida Para Term Preterm AB TAB SAB Ectopic Multiple Living   2 2 2        2      Review of Systems  Constitutional: Negative for fever and chills.  HENT: Positive for dental problem and facial swelling. Negative for trouble swallowing and voice change.   Respiratory: Negative for shortness of breath.   Gastrointestinal: Negative for nausea and vomiting.  10 Systems reviewed and all are negative for acute change except as noted in the HPI.  Allergies  Review of patient's allergies indicates no known allergies.  Home Medications   Prior to Admission medications   Medication Sig Start Date End Date Taking? Authorizing Provider  albuterol (PROVENTIL HFA;VENTOLIN HFA) 108 (90 BASE) MCG/ACT inhaler Inhale 2 puffs into the lungs every 6 (six) hours as needed. Asthma attacks, emergency use only.    Historical Provider, MD  metroNIDAZOLE (METROGEL VAGINAL) 0.75 % vaginal gel Place 1 Applicatorful vaginally at bedtime. For 5 nights 05/23/15   05/25/15, MD  nystatin cream (MYCOSTATIN) Apply to affected area 2 times daily 07/20/14   09/19/14, PA-C  Prenatal Vit-Fe Fumarate-FA (PRENATAL MULTIVITAMIN) TABS Take 1 tablet by mouth at bedtime.    Historical Provider, MD  triamcinolone cream (KENALOG) 0.1 % Apply 1 application topically 2 (two) times daily. 07/20/14   09/19/14, PA-C   Triage Vitals: BP 119/69 mmHg  Pulse 83  Temp(Src) 97.6 F (36.4 C) (Oral)  Resp 20  SpO2 100%  Physical Exam  Constitutional: She is oriented to person, place, and time. She appears well-developed and well-nourished. No distress.  HENT:  Head: Normocephalic and atraumatic.  Mouth/Throat: Uvula is midline, oropharynx is clear and moist and mucous membranes are normal. She does not have dentures. No trismus in the jaw. No dental abscesses or lacerations.    Eyes: Conjunctivae and EOM are normal.  Neck: Neck supple. No tracheal deviation present.  Cardiovascular: Normal rate.   Pulmonary/Chest: Effort normal. No respiratory distress.  Musculoskeletal: Normal range of motion.   Neurological: She is alert and oriented to person, place, and time.  Skin: Skin is warm and dry.  Psychiatric: She has a normal mood and affect. Her behavior is normal.  Nursing note and vitals reviewed.   ED Course  Procedures (including critical care time)  DIAGNOSTIC STUDIES: Oxygen Saturation is 100% on RA, normal by my interpretation.    COORDINATION OF CARE:  Labs Review Labs Reviewed - No data to display  Imaging Review No results found.    EKG Interpretation None     MDM  I have reviewed the relevant previous healthcare records. I obtained HPI from historian.  ED Course:  Assessment: 14y F with no sign pmh presents with dental pain for the past 4 days. She is scheduled to see a dentsit on Monday. On exam she has no obvious swelling, no apparent abscess, afebrile, no trismus. Neg ludwigs angina. No changes in voice. Will have her follow up with dental clinic on Monday. Given Tylenol 3 for severe pain and instructed to use ibuprofen for symptm control. Patient is in no acute distress. Vital Signs are stable. Patient is able to ambulate. Patient able to tolerate PO.     Disposition/Plan:  DC Home Additional Verbal discharge instructions given and discussed with patient.  Pt Instructed to f/u with dentist at scheduled appointment for evaluation and treatment of symptoms. Return precautions given Pt acknowledges and agrees with plan  Supervising Physician Melene Plan, DO   Final diagnoses:  Pain, dental    I personally performed the services described in this documentation, which was scribed in my presence. The recorded information has been reviewed and is accurate.    Audry Pili, PA-C 12/22/15 1016  Melene Plan, DO 12/22/15 1032

## 2015-12-22 NOTE — ED Notes (Signed)
Declined W/C at D/C and was escorted to lobby by RN. 

## 2016-06-11 DIAGNOSIS — R21 Rash and other nonspecific skin eruption: Secondary | ICD-10-CM | POA: Diagnosis not present

## 2016-06-11 DIAGNOSIS — N898 Other specified noninflammatory disorders of vagina: Secondary | ICD-10-CM | POA: Diagnosis not present

## 2016-06-11 DIAGNOSIS — R5383 Other fatigue: Secondary | ICD-10-CM | POA: Diagnosis not present

## 2016-06-11 DIAGNOSIS — R635 Abnormal weight gain: Secondary | ICD-10-CM | POA: Diagnosis not present

## 2016-06-26 DIAGNOSIS — E669 Obesity, unspecified: Secondary | ICD-10-CM | POA: Diagnosis not present

## 2016-06-26 DIAGNOSIS — Z136 Encounter for screening for cardiovascular disorders: Secondary | ICD-10-CM | POA: Diagnosis not present

## 2016-07-08 DIAGNOSIS — Z0100 Encounter for examination of eyes and vision without abnormal findings: Secondary | ICD-10-CM | POA: Diagnosis not present

## 2016-08-07 DIAGNOSIS — E669 Obesity, unspecified: Secondary | ICD-10-CM | POA: Diagnosis not present

## 2016-08-22 ENCOUNTER — Ambulatory Visit: Payer: PRIVATE HEALTH INSURANCE | Admitting: Internal Medicine

## 2016-10-02 DIAGNOSIS — L509 Urticaria, unspecified: Secondary | ICD-10-CM | POA: Diagnosis not present

## 2016-10-02 DIAGNOSIS — M2550 Pain in unspecified joint: Secondary | ICD-10-CM | POA: Diagnosis not present

## 2016-10-09 DIAGNOSIS — R768 Other specified abnormal immunological findings in serum: Secondary | ICD-10-CM | POA: Diagnosis not present

## 2016-10-09 DIAGNOSIS — M2550 Pain in unspecified joint: Secondary | ICD-10-CM | POA: Diagnosis not present

## 2016-10-09 DIAGNOSIS — R5382 Chronic fatigue, unspecified: Secondary | ICD-10-CM | POA: Diagnosis not present

## 2016-10-09 DIAGNOSIS — R21 Rash and other nonspecific skin eruption: Secondary | ICD-10-CM | POA: Diagnosis not present

## 2016-11-16 ENCOUNTER — Ambulatory Visit (INDEPENDENT_AMBULATORY_CARE_PROVIDER_SITE_OTHER): Payer: Self-pay | Admitting: Allergy

## 2016-11-16 ENCOUNTER — Encounter (INDEPENDENT_AMBULATORY_CARE_PROVIDER_SITE_OTHER): Payer: Self-pay

## 2016-11-16 ENCOUNTER — Encounter: Payer: Self-pay | Admitting: Allergy

## 2016-11-16 VITALS — BP 132/90 | HR 107 | Temp 98.2°F | Resp 16 | Ht <= 58 in | Wt 180.6 lb

## 2016-11-16 DIAGNOSIS — J4520 Mild intermittent asthma, uncomplicated: Secondary | ICD-10-CM

## 2016-11-16 DIAGNOSIS — T783XXA Angioneurotic edema, initial encounter: Secondary | ICD-10-CM | POA: Diagnosis not present

## 2016-11-16 DIAGNOSIS — L508 Other urticaria: Secondary | ICD-10-CM

## 2016-11-16 DIAGNOSIS — J00 Acute nasopharyngitis [common cold]: Secondary | ICD-10-CM

## 2016-11-16 MED ORDER — MONTELUKAST SODIUM 10 MG PO TABS: 10.0000 mg | ORAL_TABLET | Freq: Every day | ORAL | 5 refills | Status: DC

## 2016-11-16 MED ORDER — CETIRIZINE HCL 10 MG PO TABS: 10.0000 mg | ORAL_TABLET | Freq: Two times a day (BID) | ORAL | 5 refills | Status: DC

## 2016-11-16 MED ORDER — RANITIDINE HCL 150 MG PO TABS: 150.0000 mg | ORAL_TABLET | Freq: Two times a day (BID) | ORAL | 4 refills | Status: DC

## 2016-11-16 MED ORDER — ALBUTEROL SULFATE HFA 108 (90 BASE) MCG/ACT IN AERS: 2.0000 | INHALATION_SPRAY | RESPIRATORY_TRACT | 4 refills | Status: DC | PRN

## 2016-11-16 NOTE — Patient Instructions (Signed)
Hives and swelling    - you have components of your hives that are concerning including bruising and joint pains.  It is reassuring that you have been evaluated by rheumatology.  If hives do not continue to lessen would recommend dermatology evaluation with biopsy to assess for other causes of urticaria like vasculitis   - will obtain chronic urticaria panel   - start Zyrtec 10mg  and Zantac 150mg  take both twice a day.     - start Singulair 10mg  daily take at bedtime  History of asthma    - will prescribe an albuterol inhaler for as needed use    - monitor frequency of use  URI    - use saline rinse daily to help cleanse nasal passages    - may use Mucinex 1200mg  daily with plenty of water to help thin/drain mucus    - let know if symptoms persist past a week duration and worsening   Follow-up 2 months

## 2016-11-16 NOTE — Progress Notes (Signed)
New Patient Note  RE: [name] MRN: [id number] DOB: October 30, 1986 Date of Office Visit: 11/16/2016  Referring provider: Nicholes Rough, PA-C Primary care provider: Nicholes Rough, PA-C  Chief Complaint: Hives  History of present illness: [name] is a 30 y.o. female presenting today for consultation for hives.  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  She reports initially the rash was only occurring at work but then the rash would occur at any time and any place even outside of work.   The rash is itchy and described as red, raised, welt-like.  Rash is occurring all over the body.  She would take a benadryl at the end of the day after her shift ended and reports the rash would resolve by morning.  She had an area on her chest that developed a large bruise or a cluster of hives had been.  The rash would sometimes leave some red marks that would take additional day or 2 to resolve. She has had some eye swelling with the rash.  The rash is starting to slow down in frequency but still occurs almost daily.  Rash started around July 17 and reports it got worse around Aug and September.   She was also having joint pains with the rash overlying the joints where the rash was especially her finger and wrists.   She saw her PCP who did lab work including rheumatologic labs (see labs below).  She was referred to see rheumatology and has had her visit with them and per patient she was reassured that there is no autoimmune or underlying rheumatologic issues and plans to follow-up in the spring.  She has not seen dermatology at this point. She also started taking ibuprofen to help with the joint pain symptoms.   Denies any preceding illnesses, no new foods, no stings, no changes in soaps/lotion/detergents.      No nasal or ocular symptoms suggestive of allergic  rhinoconjunctivitis.  She has a history of asthma that was worse in childhood and now it flares only when she is sick.  She has no rescue inhaler at this time.   She denies any history of having food allergy or eczema .   Review of systems: Review of Systems  Constitutional: Negative for chills, fever and malaise/fatigue.  HENT: Negative for congestion, sinus pain and sore throat.   Eyes: Negative for discharge and redness.  Respiratory: Negative for cough, shortness of breath and wheezing.   Cardiovascular: Negative for chest pain and palpitations.  Gastrointestinal: Negative for heartburn, nausea and vomiting.  Musculoskeletal: Positive for joint pain.  Skin: Positive for itching and rash.    All other systems negative unless noted above in HPI  Past medical history: Past Medical History:  Diagnosis Date  . Asthma   . Eczema   . Urinary tract infection   . Urticaria     Past surgical history: Past Surgical History:  Procedure Laterality Date  . CESAREAN SECTION    . CESAREAN SECTION  08/21/2012   Procedure: CESAREAN SECTION;  Surgeon: Daria Pastures, MD;  Location: [facility];  Service: Obstetrics;  Laterality: N/A;    Family history:  Family History  Problem Relation Age of Onset  . Allergic rhinitis Mother   . Allergic rhinitis Father   . Allergic rhinitis Sister   . Asthma  Sister   . Eczema Maternal Uncle   . Other Neg Hx   No history of autoimmune, urticaria or angioedema in the family   Social history: She lives in a home with carpeting with electric heating and central cooling. There are no pets in the home. There is no concern for water damage or mildew in the home or roaches. She is a Equities trader in labor and delivery.  Social History Main Topics  . Smoking status: Former Smoker    Types: Cigarettes  . Smokeless tobacco: Never Used  . Alcohol use No     Comment: not with preg  . Drug use: No  . Sexual activity: Yes    Birth control/ protection:  IUD    Medication List:   Medication List       Accurate as of 11/16/16  1:58 PM. Always use your most recent med list.          acetaminophen-codeine 300-30 MG tablet Commonly known as:  TYLENOL #3 Take 1 tablet by mouth every 6 (six) hours as needed for severe pain.   albuterol 108 (90 Base) MCG/ACT inhaler Commonly known as:  PROVENTIL HFA;VENTOLIN HFA Inhale 2 puffs into the lungs every 4 (four) hours as needed for wheezing or shortness of breath. Asthma attacks, emergency use only.   metroNIDAZOLE 0.75 % vaginal gel Commonly known as:  METROGEL VAGINAL Place 1 Applicatorful vaginally at bedtime. For 5 nights   nystatin cream Commonly known as:  MYCOSTATIN Apply to affected area 2 times daily   phentermine 37.5 MG tablet Commonly known as:  ADIPEX-P Take 7.5 mg by mouth daily.   prenatal multivitamin Tabs tablet Take 1 tablet by mouth at bedtime.   triamcinolone cream 0.1 % Commonly known as:  KENALOG Apply 1 application topically 2 (two) times daily.       Known medication allergies: No Known Allergies   Physical examination: Blood pressure 132/90, pulse (!) 107, temperature 98.2 F (36.8 C), temperature source Oral, resp. rate 16, height _0  (1.448 m), weight 180 lb 9.6 oz (81.9 kg), SpO2 98 %, unknown if currently breastfeeding.  General: Alert, interactive, in no acute distress. HEENT: TMs pearly gray, turbinates non-edematous with clear discharge, post-pharynx non erythematous. Neck: Supple without lymphadenopathy. Lungs: Clear to auscultation without wheezing, rhonchi or rales. {no increased work of breathing. CV: Normal S1, S2 without murmurs. Abdomen: Nondistended, nontender. Skin: Scattered erythematous urticarial type lesions primarily located Chest and arms , nonvesicular. Extremities:  No clubbing, cyanosis or edema. Neuro:   Grossly intact.  Diagnositics/Labs: Labs:  Labs obtained from PCP in October 2017 - unremarkable CBC and CMP,  CRP  15.7 (elevated), RF factor 28.2 (elevated), ANA negative, T4 normal, ESR normal  Spirometry: FEV1: 2.35L  104%, FVC: 3.64L  146%, ratio consistent with nonbstructive pattern  Allergy testing: Deferred due to ongoing urticaria lesions Allergy testing results were read and interpreted by provider, documented by clinical staff.   Assessment and plan:   Urticaria and angioedema     -  She has concerning symptoms of urticaria and angioedema including bruising and joint pains.  It is however reassuring that she has been evaluated by rheumatology who does not feel that she has any underlying rheumatologic issues at this time despite having positive RF factor and elevated inflammatory markers.   She is having less frequent urticaria and angioedema which may indicate that she will soon not have anymore episodes. However if she continues to have symptoms would recommend she be  evaluated by dermatology with biopsy to exclude a vasculitic component.     - will obtain chronic urticaria panel   - start Zyrtec 23m and Zantac 1552mtake both twice a day.     - start Singulair 1051maily take at bedtime   - If she continues to struggle we'll consider Xolair therapy  History of asthma, mild intermittent    - will prescribe an albuterol inhaler for as needed use    - monitor frequency of use  URI, viral    - use saline rinse daily to help cleanse nasal passages    - may use Mucinex 1200m75mily with plenty of water to help thin/drain mucus    - let us [place] if symptoms persist past a week duration and worsening   Follow-up 2 months   I appreciate the opportunity to take part in [name]'s care. Please do not hesitate to contact me with questions.  Sincerely,   ShayPrudy Feeler [facility]

## 2017-09-02 DIAGNOSIS — R5383 Other fatigue: Secondary | ICD-10-CM | POA: Diagnosis not present

## 2017-09-02 DIAGNOSIS — M2550 Pain in unspecified joint: Secondary | ICD-10-CM | POA: Diagnosis not present

## 2017-09-02 DIAGNOSIS — R109 Unspecified abdominal pain: Secondary | ICD-10-CM | POA: Diagnosis not present

## 2017-09-02 DIAGNOSIS — Z0000 Encounter for general adult medical examination without abnormal findings: Secondary | ICD-10-CM | POA: Diagnosis not present

## 2017-09-02 DIAGNOSIS — R238 Other skin changes: Secondary | ICD-10-CM | POA: Diagnosis not present

## 2017-10-24 DIAGNOSIS — L508 Other urticaria: Secondary | ICD-10-CM | POA: Diagnosis not present

## 2017-10-24 DIAGNOSIS — L258 Unspecified contact dermatitis due to other agents: Secondary | ICD-10-CM | POA: Diagnosis not present

## 2018-02-17 ENCOUNTER — Emergency Department (HOSPITAL_BASED_OUTPATIENT_CLINIC_OR_DEPARTMENT_OTHER)
Admission: EM | Admit: 2018-02-17 | Discharge: 2018-02-17 | Disposition: A | Discharge status: Home / Self Care | Payer: No Typology Code available for payment source | Attending: Emergency Medicine | Admitting: Emergency Medicine

## 2018-02-17 ENCOUNTER — Encounter (HOSPITAL_BASED_OUTPATIENT_CLINIC_OR_DEPARTMENT_OTHER): Payer: Self-pay | Admitting: *Deleted

## 2018-02-17 ENCOUNTER — Other Ambulatory Visit: Payer: Self-pay

## 2018-02-17 DIAGNOSIS — Z79899 Other long term (current) drug therapy: Secondary | ICD-10-CM | POA: Diagnosis not present

## 2018-02-17 DIAGNOSIS — R21 Rash and other nonspecific skin eruption: Secondary | ICD-10-CM | POA: Insufficient documentation

## 2018-02-17 DIAGNOSIS — J45909 Unspecified asthma, uncomplicated: Secondary | ICD-10-CM | POA: Diagnosis not present

## 2018-02-17 MED ORDER — DEXAMETHASONE 6 MG PO TABS
10.0000 mg | ORAL_TABLET | Freq: Once | ORAL | Status: AC
  Administered 2018-02-17: 10 mg via ORAL
  Filled 2018-02-17: qty 1

## 2018-02-17 MED ORDER — FAMOTIDINE 20 MG PO TABS: 20.0000 mg | ORAL_TABLET | Freq: Two times a day (BID) | ORAL | 0 refills | Status: DC

## 2018-02-17 MED ORDER — FAMOTIDINE 20 MG PO TABS
20.0000 mg | ORAL_TABLET | Freq: Once | ORAL | Status: AC
  Administered 2018-02-17: 20 mg via ORAL
  Filled 2018-02-17: qty 1

## 2018-02-17 MED ORDER — DIPHENHYDRAMINE HCL 25 MG PO CAPS
25.0000 mg | ORAL_CAPSULE | Freq: Once | ORAL | Status: DC
  Filled 2018-02-17: qty 1

## 2018-02-17 NOTE — ED Notes (Signed)
ED Provider at bedside. 

## 2018-02-17 NOTE — ED Triage Notes (Addendum)
Pt states that she has had a rash ongoing for over a year and has been seen by MD and they are not sure of a diagnosis. Today c/o redness to palms that is burning. On assessment bilateral palms do look red in color. Denies any new products, foods, or meds. Also c/o rash to left upper arm area. Also anterior neck lymph nodes hurting and generally aching all over.

## 2018-02-17 NOTE — ED Notes (Signed)
Pt states she has "flare ups" of a raised reddened rash to her arms, legs and feet off and on x 1-1/2 years. Tonight rash flared up again and she noticed that her hands were "burning" all day which was new. This has gotten worse over the past few hours. Denies itching.

## 2018-02-17 NOTE — ED Provider Notes (Signed)
[facility] EMERGENCY DEPARTMENT Provider Note   CSN: [id number] Arrival date & time: 02/17/18  0139     History   Chief Complaint Chief Complaint  Patient presents with  . redness to hands/rash    HPI [name] is a 32 y.o. female.  HPI  This is a 32 year old female who presents with a rash and hand burning.  Patient reports 39-month history of waxing and waning rash.  She states that she has seen a rheumatologist and has had an elevated rheumatoid factor and CRP but no definitive diagnosis.  At times she improved with steroids.  She states that over the last several days she has had "a flare."  However today she developed burning in her hands which was new.  It was bilateral.  She noted erythema to the hands.  Denies any new medications, detergents, soaps, lotions.  Denies any systemic illness.  The rash is nonpainful and not itchy.  Past Medical History:  Diagnosis Date  . Asthma   . Eczema   . Urinary tract infection   . Urticaria     Patient Active Problem List   Diagnosis Date Noted  . CANDIDIASIS, VAGINAL 08/09/2010  . OBESITY 08/09/2010  . ASTHMA, CHILDHOOD 08/09/2010  . BREAST TENDERNESS 08/09/2010    Past Surgical History:  Procedure Laterality Date  . CESAREAN SECTION    . CESAREAN SECTION  08/21/2012   Procedure: CESAREAN SECTION;  Surgeon: Loney Laurence, MD;  Location: [facility];  Service: Obstetrics;  Laterality: N/A;    OB History    Gravida Para Term Preterm AB Living   2 2 2     2    SAB TAB Ectopic Multiple Live Births           2       Home Medications    Prior to Admission medications   Medication Sig Start Date End Date Taking? Authorizing Provider  acetaminophen-codeine (TYLENOL #3) 300-30 MG tablet Take 1 tablet by mouth every 6 (six) hours as needed for severe pain. Patient not taking: Reported on 11/16/2016 12/22/15   02/19/16, PA-C  albuterol (PROVENTIL HFA;VENTOLIN HFA) 108 (90 Base) MCG/ACT inhaler Inhale 2 puffs  into the lungs every 4 (four) hours as needed for wheezing or shortness of breath. Asthma attacks, emergency use only. 11/16/16   14/8/17, MD  cetirizine (ZYRTEC) 10 MG tablet Take 1 tablet (10 mg total) by mouth 2 (two) times daily. 11/16/16   14/8/17, MD  famotidine (PEPCID) 20 MG tablet Take 1 tablet (20 mg total) by mouth 2 (two) times daily. 02/17/18   Horton, 04/19/18, MD  metroNIDAZOLE (METROGEL VAGINAL) 0.75 % vaginal gel Place 1 Applicatorful vaginally at bedtime. For 5 nights Patient not taking: Reported on 11/16/2016 05/23/15   05/25/15, MD  montelukast (SINGULAIR) 10 MG tablet Take 1 tablet (10 mg total) by mouth at bedtime. 11/16/16   14/8/17, MD  nystatin cream (MYCOSTATIN) Apply to affected area 2 times daily Patient not taking: Reported on 11/16/2016 07/20/14   09/19/14, PA-C  phentermine (ADIPEX-P) 37.5 MG tablet Take 7.5 mg by mouth daily. 09/03/16   [provider]  Prenatal Vit-Fe Fumarate-FA (PRENATAL MULTIVITAMIN) TABS Take 1 tablet by mouth at bedtime.    [provider]  ranitidine (ZANTAC) 150 MG tablet Take 1 tablet (150 mg total) by mouth 2 (two) times daily. 11/16/16   14/8/17, MD  triamcinolone cream (KENALOG) 0.1 %  Apply 1 application topically 2 (two) times daily. Patient not taking: Reported on 11/16/2016 07/20/14   Marny Lowenstein, PA-C    Family History Family History  Problem Relation Age of Onset  . Allergic rhinitis Mother   . Allergic rhinitis Father   . Allergic rhinitis Sister   . Asthma Sister   . Eczema Maternal Uncle   . Other Neg Hx     Social History Social History   Tobacco Use  . Smoking status: Never Smoker  . Smokeless tobacco: Never Used  Substance Use Topics  . Alcohol use: Yes    Comment: socially  . Drug use: No     Allergies   Patient has no known allergies.   Review of Systems Review of Systems  Constitutional: Negative for  chills.  Musculoskeletal:       Burning hands  Skin: Positive for color change and rash.  All other systems reviewed and are negative.    Physical Exam Updated Vital Signs BP 138/84 (BP Location: Left Arm)   Pulse 84   Temp 97.9 F (36.6 C) (Oral)   Resp 18   Ht 4\' 10"  (1.473 m)   Wt 86.2 kg (190 lb)   SpO2 100%   BMI 39.71 kg/m   Physical Exam  Constitutional: She is oriented to person, place, and time. She appears well-developed and well-nourished. No distress.  HENT:  Head: Normocephalic and atraumatic.  Cardiovascular: Normal rate and regular rhythm.  Pulmonary/Chest: Effort normal. No respiratory distress. She has no wheezes.  Musculoskeletal:  No evidence of finger swelling and normal range of motion of all joints  Neurological: She is alert and oriented to person, place, and time.  Skin: Skin is warm and dry.  Slight erythema patchy noted over the bilateral palms and fingers, multiple slightly raised less than 1 cm erythematous patches bilateral upper extremities, blanching  Psychiatric: She has a normal mood and affect.  Nursing note and vitals reviewed.    ED Treatments / Results  Labs (all labs ordered are listed, but only abnormal results are displayed) Labs Reviewed - No data to display  EKG  EKG Interpretation None       Radiology No results found.  Procedures Procedures (including critical care time)  Medications Ordered in ED Medications  diphenhydrAMINE (BENADRYL) capsule 25 mg (not administered)  dexamethasone (DECADRON) tablet 10 mg (10 mg Oral Given 02/17/18 0359)  famotidine (PEPCID) tablet 20 mg (20 mg Oral Given 02/17/18 0358)     Initial Impression / Assessment and Plan / ED Course  I have reviewed the triage vital signs and the nursing notes.  Pertinent labs & imaging results that were available during my care of the patient were reviewed by me and considered in my medical decision making (see chart for details).     Patient  presents with a rash and hand burning.  Her hand burning is improving since initial presentation.  She has a persistent urticarial type rash.  However, it is non-itchy.  She does report some prior improvement with steroids.  She was given IM Decadron and Benadryl Pepcid.  Doubt acute emergent process.  She does not appear to be having anaphylaxis and there is no evidence of infection.  Recommend follow-up with her rheumatologist.  After history, exam, and medical workup I feel the patient has been appropriately medically screened and is safe for discharge home. Pertinent diagnoses were discussed with the patient. Patient was given return precautions.   Final Clinical Impressions(s) / ED  Diagnoses   Final diagnoses:  Rash    ED Discharge Orders        Ordered    famotidine (PEPCID) 20 MG tablet  2 times daily     02/17/18 0359       Horton, Mayer Masker, MD 02/17/18 [phone]

## 2018-02-17 NOTE — Discharge Instructions (Signed)
You were seen today for rash and burning of the hands.  The cause is unknown at this time.  Take Pepcid as needed daily.  You are given a long-acting steroid.  Follow-up with your primary physician and rheumatologist.

## 2018-02-27 ENCOUNTER — Ambulatory Visit: Payer: No Typology Code available for payment source | Admitting: Family Medicine

## 2018-03-06 ENCOUNTER — Encounter: Payer: Self-pay | Admitting: Family

## 2018-03-06 ENCOUNTER — Ambulatory Visit (INDEPENDENT_AMBULATORY_CARE_PROVIDER_SITE_OTHER): Payer: No Typology Code available for payment source | Admitting: Family

## 2018-03-06 VITALS — BP 110/69 | HR 103 | Temp 99.5°F | Resp 16 | Ht <= 58 in | Wt 192.0 lb

## 2018-03-06 DIAGNOSIS — L509 Urticaria, unspecified: Secondary | ICD-10-CM | POA: Diagnosis not present

## 2018-03-06 DIAGNOSIS — M2550 Pain in unspecified joint: Secondary | ICD-10-CM

## 2018-03-06 LAB — C-REACTIVE PROTEIN: CRP: 5 mg/dL (ref 0.5–20.0)

## 2018-03-06 LAB — SEDIMENTATION RATE: Sed Rate: 95 mm/hr — ABNORMAL HIGH (ref 0–20)

## 2018-03-06 MED ORDER — LEVONORGESTREL 20 MCG/24HR IU IUD: 1.0000 | INTRAUTERINE_SYSTEM | Freq: Once | INTRAUTERINE | 0 refills | Status: DC

## 2018-03-06 NOTE — Progress Notes (Signed)
Subjective:    Patient ID: [name], female    DOB: [birth year], 32 y.o.   MRN: [id number]  HPI   [name] is a 32 yr old female who presents today to establish care. She was with [facility].   Pmhx is significant for idiopathic urticaria- ahs seen rheumatology, dermatology and allergist.  Initially occurred on hands at work, then started to spread all over her body.  She reports that after a flare she has joint pain.  She reports that her CRP was elevated at one point. Rheumatology.  She saw a [place] rheumatology.  She never completed blood work for the allergist.  antihisamines do not help.  She reports that she has had mild lip swelling in the past but no tongue swelling or SOB. Denies family hx of urticaria.    Review of Systems  Constitutional:       She has lost about 20 pounds with phentermine, Wt Readings from Last 3 Encounters: 03/06/18 : 192 lb (87.1 kg) 02/17/18 : 190 lb (86.2 kg) 11/16/16 : 180 lb 9.6 oz (81.9 kg)   HENT: Negative for hearing loss and rhinorrhea.   Eyes: Negative for visual disturbance.  Respiratory: Negative for cough.   Cardiovascular: Negative for leg swelling.  Gastrointestinal: Negative for constipation and diarrhea.  Genitourinary: Negative for dysuria, frequency and menstrual problem.  Musculoskeletal: Negative for back pain.  Skin: Positive for rash.  Neurological: Negative for headaches.  Hematological:       Reports some swollen glands in her neck which occurs with rash/urticarial flares then resolved.   Psychiatric/Behavioral:       She denies depression/anxiety   Past Medical History:  Diagnosis Date  . Asthma   . Urticaria      Social History   Socioeconomic History  . Marital status: Single    Spouse name: Not on file  . Number of children: Not on file  . Years of education: Not on file  . Highest education level: Not on file  Occupational History  . Occupation: Optician, dispensing: [employer]  Social Needs  . Financial  resource strain: Not hard at all  . Food insecurity:    Worry: Never true    Inability: Never true  . Transportation needs:    Medical: No    Non-medical: No  Tobacco Use  . Smoking status: Never Smoker  . Smokeless tobacco: Never Used  Substance and Sexual Activity  . Alcohol use: Yes    Comment: socially 2 per week  . Drug use: No  . Sexual activity: Yes    Partners: Male    Birth control/protection: IUD  Lifestyle  . Physical activity:    Days per week: 0 days    Minutes per session: Not on file  . Stress: Only a little  Relationships  . Social connections:    Talks on phone: More than three times a week    Gets together: Never    Attends religious service: Never    Active member of club or organization: Not on file    Attends meetings of clubs or organizations: Never    Relationship status: Not on file  . Intimate partner violence:    Fear of current or ex partner: Not on file    Emotionally abused: Not on file    Physically abused: Not on file    Forced sexual activity: Not on file  Other Topics Concern  . 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      Past Surgical History:  Procedure Laterality Date  . CESAREAN SECTION    . CESAREAN SECTION  08/21/2012   Procedure: CESAREAN SECTION;  Surgeon: Daria Pastures, MD;  Location: [facility];  Service: Obstetrics;  Laterality: N/A;    Family History  Problem Relation Age of Onset  . Fibromyalgia Mother   . Deep vein thrombosis Mother        recurrent dvt  . Eczema Maternal Uncle   . Migraines Sister   . Other Neg Hx     No Known Allergies  Current Outpatient Medications on File Prior to Visit  Medication Sig Dispense Refill  . albuterol (PROVENTIL HFA;VENTOLIN HFA) 108 (90 Base) MCG/ACT inhaler Inhale 2 puffs into the lungs every 4 (four)  hours as needed for wheezing or shortness of breath. Asthma attacks, emergency use only. 1 Inhaler 4  . cetirizine (ZYRTEC) 10 MG tablet Take 1 tablet (10 mg total) by mouth 2 (two) times daily. 60 tablet 5  . nystatin cream (MYCOSTATIN) Apply to affected area 2 times daily 15 g 0  . phentermine (ADIPEX-P) 37.5 MG tablet Take 7.5 mg by mouth daily.  1  . ranitidine (ZANTAC) 150 MG tablet Take 1 tablet (150 mg total) by mouth 2 (two) times daily. 60 tablet 4  . Prenatal Vit-Fe Fumarate-FA (PRENATAL MULTIVITAMIN) TABS Take 1 tablet by mouth at bedtime.     No current facility-administered medications on file prior to visit.     BP 110/69 (BP Location: Right Arm, Patient Position: Sitting, Cuff Size: Large)   Pulse (!) 103   Temp 99.5 F (37.5 C) (Oral)   Resp 16   Ht '4\' 10"'$  (1.473 m)   Wt 192 lb (87.1 kg)   SpO2 100%   BMI 40.13 kg/m        Objective:   Physical Exam  Constitutional: She appears well-developed and well-nourished.  HENT:  Head: Normocephalic and atraumatic.  No tongue/lip swelling noted  Neck: Neck supple.  Cardiovascular: Normal rate, regular rhythm and normal heart sounds.  No murmur heard. Pulmonary/Chest: Effort normal and breath sounds normal. No respiratory distress. She has no wheezes.  Musculoskeletal: She exhibits no edema.  + urticaria trunk/legs, arms  Lymphadenopathy:    She has no cervical adenopathy.  Skin: Skin is warm and dry.  Psychiatric: She has a normal mood and affect. Her behavior is normal. Judgment and thought content normal.          Assessment & Plan:  Urticaria- will refer back to allergy- I really thinks she should complete allergy testing to rule out food allergy.  Joint pain- check CRP, ESR, ANA, rheumatoid factor- if abnormal plan referral to rheumatology (Dr. Estanislado Pandy).

## 2018-03-06 NOTE — Patient Instructions (Signed)
Please complete lab work prior to leaving. You should be contacted about your referral to the allergist.

## 2018-03-09 LAB — RHEUMATOID FACTOR: RHEUMATOID FACTOR: 44 [IU]/mL — AB (ref <14)

## 2018-03-09 LAB — ANA: ANA: NEGATIVE

## 2018-03-10 ENCOUNTER — Telehealth: Payer: Self-pay | Admitting: Family

## 2018-03-10 DIAGNOSIS — M2550 Pain in unspecified joint: Secondary | ICD-10-CM

## 2018-03-10 NOTE — Telephone Encounter (Signed)
See my chart message

## 2018-03-19 ENCOUNTER — Encounter: Payer: Self-pay | Admitting: Family

## 2018-03-24 ENCOUNTER — Telehealth: Payer: Self-pay | Admitting: Family

## 2018-03-24 NOTE — Telephone Encounter (Signed)
Copied from CRM [id number]. Topic: Quick Communication - See Telephone Encounter >> Mar 24, 2018 12:11 PM Floria Raveling A wrote: CRM for notification. See Telephone encounter for: 03/24/18. Pt called in and said that there was suppose have a steroid pack sent in to the pharmacy .  Per my chart message.  Se message on 4/10  [facility] on [address] in [place]

## 2018-03-25 ENCOUNTER — Encounter: Payer: Self-pay | Admitting: Family

## 2018-03-26 MED ORDER — PREDNISONE 10 MG PO TABS: 10.0000 mg | ORAL_TABLET | Freq: Every day | ORAL | 0 refills | Status: DC

## 2018-03-26 NOTE — Telephone Encounter (Signed)
Rx sent, mychart message sent to pt.  Sandford Craze, NP  to Me      3:23 PM   Please send rx for prednisone 10 mg tabs, 4 tabs daily x 2 days, 3 tabs daily x 2 days, 2 tabs daily x 2 days, 1 tab daily x2 days . #20 tabs

## 2018-03-26 NOTE — Addendum Note (Signed)
Addended by: Mervin Kung A on: 03/26/2018 10:29 AM   Modules accepted: Orders

## 2018-03-26 NOTE — Telephone Encounter (Signed)
Patient has not heard anything, please advise

## 2018-03-26 NOTE — Telephone Encounter (Signed)
See 03/19/18 patient email. Request completed.

## 2018-03-31 NOTE — Progress Notes (Signed)
Office Visit Note  Patient: [name]             Date of Birth: 1986/03/27           MRN: [id number]             PCP: Debbrah Alar, NP Referring: Debbrah Alar, NP Visit Date: 04/02/2018 Occupation: RN at [employer]    Subjective:  Joint pain and rash.   History of Present Illness: [name] is a 32 y.o. female seen in consultation per request of her PCP.  According to patient in July 2017 she developed a rash on her hands.  She works as a Marine scientist and she initially thought that the rash was related to wearing gloves.  Gradually the rash is spread to other parts of the body.  In October 2017 she starts having joint pain which was migratory.  She also noticed increased bruising.  She was seen by an allergist who advised different antihistamines and now she takes Zyrtec.  She was seen by a rheumatologist in November 2017 who did some lab work but no diagnosis was given and no treatment was offered.  She states she did well for several months and then her symptoms restarted in March 2019.  She started having chest pain, rash on her face and burning sensation in her hands and feet.  She states she also gets sore throat and low-grade fever.  She describes joint pain in her right knee, bilateral wrist and bilateral hands.  She denies any joint swelling.  She states she was having having a lot of discomfort for that reason her PCP gave her prednisone taper starting a week ago.  She was on 40 mg a day and now she is on the last day of her prednisone taper.  She felt much better on prednisone for the last 2 days she is having some symptoms.  Patient showed some photographs on her cell phone where she is urticaria covering almost all of her body.  She also shows a picture of Baker's cyst behind her knee.  Activities of Daily Living:  Patient reports morning stiffness for15  minutes.   Patient Reports nocturnal pain.  Difficulty dressing/grooming: denies Difficulty climbing  stairs: Reports Difficulty getting out of chair: Denies Difficulty using hands for taps, buttons, cutlery, and/or writing: Denies   Review of Systems  Constitutional: Positive for fatigue. Negative for night sweats, weight gain and weight loss.  HENT: Negative for mouth sores, trouble swallowing, trouble swallowing, mouth dryness and nose dryness.   Eyes: Negative for pain, redness, visual disturbance and dryness.  Respiratory: Negative for cough, shortness of breath and difficulty breathing.   Cardiovascular: Negative for chest pain, palpitations, hypertension, irregular heartbeat and swelling in legs/feet.  Gastrointestinal: Negative for blood in stool, constipation and diarrhea.  Endocrine: Negative for increased urination.  Genitourinary: Negative for vaginal dryness.  Musculoskeletal: Positive for myalgias, morning stiffness and myalgias. Negative for arthralgias, joint pain, joint swelling, muscle weakness and muscle tenderness.  Skin: Positive for rash. Negative for color change, hair loss, skin tightness, ulcers and sensitivity to sunlight.  Allergic/Immunologic: Negative for susceptible to infections.  Neurological: Negative for dizziness, memory loss, night sweats and weakness.  Hematological: Negative for swollen glands.  Psychiatric/Behavioral: Negative for depressed mood and sleep disturbance. The patient is not nervous/anxious.     PMFS History:  Patient Active Problem List   Diagnosis Date Noted  . CANDIDIASIS, VAGINAL 08/09/2010  . OBESITY 08/09/2010  . ASTHMA, CHILDHOOD  08/09/2010  . BREAST TENDERNESS 08/09/2010    Past Medical History:  Diagnosis Date  . Asthma   . Urticaria     Family History  Problem Relation Age of Onset  . Fibromyalgia Mother   . Deep vein thrombosis Mother        recurrent dvt  . Eczema Maternal Uncle   . Migraines Sister   . Healthy Son   . Healthy Daughter   . Other Neg Hx    Past Surgical History:  Procedure Laterality Date  .  CESAREAN SECTION  2009  . 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       Objective: Vital Signs: BP 119/80 (BP Location: Left Arm, Patient Position: Sitting, Cuff Size: Normal)   Pulse 96   Resp 14   Ht _0  (1.473 m)   Wt 188 lb 8 oz (85.5 kg)   BMI 39.40 kg/m    Physical Exam  Constitutional: She is oriented to person, place, and time. She appears well-developed and well-nourished.  HENT:  Head: Normocephalic and atraumatic.  Eyes: Conjunctivae and EOM are normal.  Neck: Normal range of motion.  Cardiovascular: Normal rate, regular rhythm, normal heart sounds and intact distal pulses.  Pulmonary/Chest: Effort normal and breath sounds normal.  Abdominal: Soft. Bowel sounds are normal.  Lymphadenopathy:    She has no cervical adenopathy.  Neurological: She is alert and oriented to person, place, and time.  Skin: Skin is warm and dry. Capillary refill takes less than 2 seconds.  Psychiatric: She has a normal mood and affect. Her behavior is normal.  Nursing note and vitals reviewed.    Musculoskeletal Exam: C-spine thoracic lumbar spine good range of motion.  Shoulder joints elbow joints wrists joint MCPs PIPs DIPs with good range of motion.  She is some tenderness on palpation of the right wrist joint.  Hip joints knee joints ankles MTPs PIPs were in good range of motion.  CDAI Exam: No CDAI exam completed.    Investigation: No additional findings.  Component     Latest Ref Rng & Units 03/06/2018  CRP     0.5 - 20.0 mg/dL 5.0  Anit Nuclear Antibody(ANA)     NEGATIVE NEGATIVE  RA Latex Turbid.     <14 IU/mL 44 (H)  Sed Rate     0 - 20 mm/hr 95  (H)    CBC Latest Ref Rng & Units 07/20/2014 08/22/2012 08/21/2012  WBC 4.0 - 10.5 K/uL 6.4 12.9(H) 8.7  Hemoglobin 12.0 - 15.0 g/dL 14.3 10.9(L) 12.8  Hematocrit 36.0 - 46.0 % 42.4 33.1(L) 37.9  Platelets 150 - 400 K/uL 300 185 221  No flowsheet data found.   Imaging: Xr Foot 2 Views Left  Result Date: 04/02/2018 Minimal first MTP narrowing was noted.  No PIP/DIP or intertarsal joint space narrowing was noted.  No erosive changes were noted.  No tibiotalar joint space narrowing was noted. Unremarkable x-ray of the foot.  Xr Foot 2 Views Right  Result Date: 04/02/2018 Minimal first MTP narrowing was noted.  No PIP/DIP or intertarsal joint space narrowing was noted.  No erosive  changes were noted.  No tibiotalar joint space narrowing was noted. Unremarkable x-ray of the foot.  Xr Hand 2 View Left  Result Date: 04/02/2018 No MCP, PIP, DIP or intercarpal joint space narrowing was noted.  No erosive changes were noted. Impression: Unremarkable x-ray of the hand.  Xr Hand 2 View Right  Result Date: 04/02/2018 No MCP, PIP, DIP or intercarpal joint space narrowing was noted.  No erosive changes were noted. Impression: Unremarkable x-ray of the hand.  Xr Knee 3 View Left  Result Date: 04/02/2018 Moderate medial compartment narrowing noted.  No chondrocalcinosis noted.  Severe patellofemoral narrowing was noted. 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  Xr Knee 3 View Right  Result Date: 04/02/2018 Moderate medial compartment narrowing noted.  No chondrocalcinosis noted.  Severe patellofemoral narrowing was noted. 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   Speciality Comments: No specialty comments available.    Procedures:  No procedures performed Allergies: Patient has no known allergies.   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  She also brought some pictures of Baker's cyst behind her knee joint.  Plan: Urinalysis, Routine w reflex microscopic, Sedimentation rate  Pain in both hands -she had no synovitis on examination today.  She just took prednisone taper and is on last day of her prednisone.  She has some tenderness on palpation of her right wrist joint.  I would like to see her off prednisone but her joints are more active.  Plan: XR Hand 2 View Right, XR Hand 2 View Left.  The x-rays were unremarkable, Cyclic citrul peptide antibody, IgG  Chronic pain of both knees -she complains of pain in the bilateral knee joints especially the right knee joint.  She also had a picture of her right knee showing Baker's cyst.  Plan: XR KNEE 3 VIEW RIGHT, XR KNEE 3 VIEW LEFT.  X-rays showed moderate osteoarthritis and severe chondromalacia patella.  Pain in both feet -she has discomfort in her feet off and on.  Plan: XR Foot 2 Views Right, XR Foot 2 Views Left.  The x-rays were unremarkable.  Rheumatoid factor positive - RF 44, sed rate 95, ANA -  High risk medication use -in anticipation to start on immunosuppressive therapy I will obtain following labs.  Plan: CBC with Differential/Platelet, COMPLETE METABOLIC PANEL WITH GFR, Glucose 6 phosphate dehydrogenase, Hepatitis B core antibody, IgM, Hepatitis B surface antigen, Hepatitis C antibody, HIV antibody, QuantiFERON-TB Gold Plus, Serum protein electrophoresis with reflex, IgG, IgA, IgM  Urticaria -she brought several photographs of the urticaria on her face and extremities.  Plan: Pan-ANCA, Sjogrens syndrome-A extractable nuclear antibody, Sjogrens syndrome-B extractable nuclear antibody, Anti-DNA antibody, double-stranded, Anti-Smith antibody, RNP Antibody, C3 and C4  History of asthma    Orders: Orders Placed This Encounter  Procedures  . XR KNEE 3 VIEW RIGHT  . XR KNEE 3 VIEW LEFT  . XR Foot 2 Views Right  . XR Foot 2 Views Left  .  XR Hand 2 View Right  . XR Hand 2 View Left  . CBC with Differential/Platelet  . COMPLETE METABOLIC PANEL WITH GFR  . Urinalysis, Routine w reflex microscopic  . Sedimentation rate  . Cyclic citrul peptide antibody, IgG  . Pan-ANCA  . Sjogrens syndrome-A extractable nuclear antibody  . Sjogrens syndrome-B extractable nuclear antibody  . Anti-DNA antibody, double-stranded  . Anti-Smith antibody  .  RNP Antibody  . C3 and C4  . Glucose 6 phosphate dehydrogenase  . Hepatitis B core antibody, IgM  . Hepatitis B surface antigen  . Hepatitis C antibody  . HIV antibody  . QuantiFERON-TB Gold Plus  . Serum protein electrophoresis with reflex  . IgG, IgA, IgM   No orders of the defined types were placed in this encounter.   Face-to-face time spent with patient was50  minutes.  Greater than 50% of time was spent in counseling and coordination of care.  Follow-Up Instructions: Return for Arthralgia, positive rheumatoid factor, elevated ESR.   Bo Merino, MD  Note - This record has been created using Editor, commissioning.  Chart creation errors have been sought, but may not always  have been located. Such creation errors do not reflect on  the standard of medical care.

## 2018-04-02 ENCOUNTER — Ambulatory Visit (INDEPENDENT_AMBULATORY_CARE_PROVIDER_SITE_OTHER): Payer: No Typology Code available for payment source

## 2018-04-02 ENCOUNTER — Ambulatory Visit (INDEPENDENT_AMBULATORY_CARE_PROVIDER_SITE_OTHER): Payer: Self-pay

## 2018-04-02 ENCOUNTER — Encounter: Payer: Self-pay | Admitting: Rheumatology

## 2018-04-02 ENCOUNTER — Ambulatory Visit: Payer: 59 | Admitting: Rheumatology

## 2018-04-02 VITALS — BP 119/80 | HR 96 | Resp 14 | Ht <= 58 in | Wt 188.5 lb

## 2018-04-02 DIAGNOSIS — M79671 Pain in right foot: Secondary | ICD-10-CM

## 2018-04-02 DIAGNOSIS — M25562 Pain in left knee: Secondary | ICD-10-CM

## 2018-04-02 DIAGNOSIS — M79641 Pain in right hand: Secondary | ICD-10-CM

## 2018-04-02 DIAGNOSIS — M25561 Pain in right knee: Secondary | ICD-10-CM | POA: Diagnosis not present

## 2018-04-02 DIAGNOSIS — L509 Urticaria, unspecified: Secondary | ICD-10-CM | POA: Diagnosis not present

## 2018-04-02 DIAGNOSIS — M79642 Pain in left hand: Secondary | ICD-10-CM

## 2018-04-02 DIAGNOSIS — M2550 Pain in unspecified joint: Secondary | ICD-10-CM

## 2018-04-02 DIAGNOSIS — M79672 Pain in left foot: Secondary | ICD-10-CM | POA: Diagnosis not present

## 2018-04-02 DIAGNOSIS — G8929 Other chronic pain: Secondary | ICD-10-CM | POA: Diagnosis not present

## 2018-04-02 DIAGNOSIS — R768 Other specified abnormal immunological findings in serum: Secondary | ICD-10-CM

## 2018-04-02 DIAGNOSIS — Z79899 Other long term (current) drug therapy: Secondary | ICD-10-CM | POA: Diagnosis not present

## 2018-04-02 DIAGNOSIS — Z8709 Personal history of other diseases of the respiratory system: Secondary | ICD-10-CM

## 2018-04-02 LAB — COMPLETE METABOLIC PANEL WITH GFR
AG RATIO: 1 (calc) (ref 1.0–2.5)
ALKALINE PHOSPHATASE (APISO): 75 U/L (ref 33–115)
ALT: 21 U/L (ref 6–29)
AST: 13 U/L (ref 10–30)
Albumin: 3.6 g/dL (ref 3.6–5.1)
BILIRUBIN TOTAL: 0.2 mg/dL (ref 0.2–1.2)
BUN: 11 mg/dL (ref 7–25)
CHLORIDE: 101 mmol/L (ref 98–110)
CO2: 29 mmol/L (ref 20–32)
Calcium: 9.3 mg/dL (ref 8.6–10.2)
Creat: 0.59 mg/dL (ref 0.50–1.10)
GFR, EST AFRICAN AMERICAN: 143 mL/min/{1.73_m2} (ref >=60)
GFR, Est Non African American: 123 mL/min/{1.73_m2} (ref >=60)
GLUCOSE: 68 mg/dL (ref 65–99)
Globulin: 3.6 g/dL (calc) (ref 1.9–3.7)
POTASSIUM: 4.2 mmol/L (ref 3.5–5.3)
Sodium: 140 mmol/L (ref 135–146)
TOTAL PROTEIN: 7.2 g/dL (ref 6.1–8.1)

## 2018-04-02 LAB — CBC WITH DIFFERENTIAL/PLATELET
BASOS ABS: 47 {cells}/uL (ref 0–200)
Basophils Relative: 0.3 %
EOS ABS: 16 {cells}/uL (ref 15–500)
Eosinophils Relative: 0.1 %
HCT: 38.6 % (ref 35.0–45.0)
HEMOGLOBIN: 12.4 g/dL (ref 11.7–15.5)
Lymphs Abs: 3077 cells/uL (ref 850–3900)
MCH: 25.6 pg — AB (ref 27.0–33.0)
MCHC: 32.1 g/dL (ref 32.0–36.0)
MCV: 79.8 fL — AB (ref 80.0–100.0)
MONOS PCT: 4.7 %
MPV: 8.8 fL (ref 7.5–12.5)
NEUTROS ABS: 11822 {cells}/uL — AB (ref 1500–7800)
Neutrophils Relative %: 75.3 %
Platelets: 549 10*3/uL — ABNORMAL HIGH (ref 140–400)
RBC: 4.84 10*6/uL (ref 3.80–5.10)
RDW: 12.7 % (ref 11.0–15.0)
Total Lymphocyte: 19.6 %
WBC mixed population: 738 cells/uL (ref 200–950)
WBC: 15.7 10*3/uL — ABNORMAL HIGH (ref 3.8–10.8)

## 2018-04-02 NOTE — Patient Instructions (Signed)

## 2018-04-03 LAB — HEPATITIS C ANTIBODY
HEP C AB: NONREACTIVE
SIGNAL TO CUT-OFF: 0.03 (ref <1.00)

## 2018-04-03 LAB — ANTI-SMITH ANTIBODY: ENA SM AB SER-ACNC: NEGATIVE AI

## 2018-04-03 LAB — PAN-ANCA
ANCA Screen: NEGATIVE
Myeloperoxidase Abs: <1 AI

## 2018-04-03 LAB — IGG, IGA, IGM
IGG (IMMUNOGLOBIN G), SERUM: 1220 mg/dL (ref 694–1618)
IgM, Serum: 82 mg/dL (ref 48–271)
Immunoglobulin A: 202 mg/dL (ref 81–463)

## 2018-04-03 LAB — URINALYSIS, ROUTINE W REFLEX MICROSCOPIC
BILIRUBIN URINE: NEGATIVE
GLUCOSE, UA: NEGATIVE
Hgb urine dipstick: NEGATIVE
KETONES UR: NEGATIVE
Leukocytes, UA: NEGATIVE
Nitrite: NEGATIVE
PH: 7.5 (ref 5.0–8.0)
Protein, ur: NEGATIVE
SPECIFIC GRAVITY, URINE: 1.02 (ref 1.001–1.03)

## 2018-04-03 LAB — ANTI-DNA ANTIBODY, DOUBLE-STRANDED: DS DNA AB: 1 [IU]/mL

## 2018-04-03 LAB — GLUCOSE 6 PHOSPHATE DEHYDROGENASE: G-6PDH: 19.6 U/g{Hb} (ref 7.0–20.5)

## 2018-04-03 LAB — SEDIMENTATION RATE: Sed Rate: 125 mm/h — ABNORMAL HIGH (ref 0–20)

## 2018-04-03 LAB — CYCLIC CITRUL PEPTIDE ANTIBODY, IGG

## 2018-04-03 LAB — HEPATITIS B CORE ANTIBODY, IGM: Hep B C IgM: NONREACTIVE

## 2018-04-03 LAB — HIV ANTIBODY (ROUTINE TESTING W REFLEX): HIV: NONREACTIVE

## 2018-04-03 LAB — RNP ANTIBODY: Ribonucleic Protein(ENA) Antibody, IgG: <1 AI

## 2018-04-03 LAB — SJOGRENS SYNDROME-B EXTRACTABLE NUCLEAR ANTIBODY: SSB (LA) (ENA) ANTIBODY, IGG: NEGATIVE AI

## 2018-04-03 LAB — HEPATITIS B SURFACE ANTIGEN: Hepatitis B Surface Ag: NONREACTIVE

## 2018-04-03 LAB — C3 AND C4
C3 Complement: 203 mg/dL — ABNORMAL HIGH (ref 83–193)
C4 Complement: 47 mg/dL (ref 15–57)

## 2018-04-03 LAB — SJOGRENS SYNDROME-A EXTRACTABLE NUCLEAR ANTIBODY: SSA (RO) (ENA) ANTIBODY, IGG: NEGATIVE AI

## 2018-04-04 ENCOUNTER — Telehealth: Payer: Self-pay | Admitting: Family

## 2018-04-04 LAB — QUANTIFERON-TB GOLD PLUS
NIL: 0.02 [IU]/mL
QuantiFERON-TB Gold Plus: NEGATIVE
TB1-NIL: 0.01 [IU]/mL
TB2-NIL: 0.01 [IU]/mL

## 2018-04-04 LAB — PROTEIN ELECTROPHORESIS, SERUM, WITH REFLEX
ALBUMIN ELP: 3.1 g/dL — AB (ref 3.8–4.8)
Alpha 1: 0.5 g/dL — ABNORMAL HIGH (ref 0.2–0.3)
Alpha 2: 1.5 g/dL — ABNORMAL HIGH (ref 0.5–0.9)
Beta 2: 0.5 g/dL (ref 0.2–0.5)
Beta Globulin: 0.4 g/dL (ref 0.4–0.6)
Gamma Globulin: 1.1 g/dL (ref 0.8–1.7)
TOTAL PROTEIN: 7.1 g/dL (ref 6.1–8.1)

## 2018-04-04 NOTE — Telephone Encounter (Signed)
Left message on [facility] answering machine requesting callback from Dr. Dr. Shana Chute.  I left my cell number.

## 2018-04-04 NOTE — Telephone Encounter (Signed)
Copied from CRM [id number]. Topic: Quick Communication - See Telephone Encounter >> Apr 04, 2018  1:53 PM Eston Mould B wrote: CRM for notification. See Telephone encounter for: 04/04/18. Montey Hora from [facility] is requesting Sandford Craze Call Dr  Corliss Skains    [phone]  to discuss pts lab results

## 2018-04-07 ENCOUNTER — Telehealth: Payer: Self-pay | Admitting: Family

## 2018-04-07 DIAGNOSIS — R700 Elevated erythrocyte sedimentation rate: Secondary | ICD-10-CM

## 2018-04-07 LAB — IFE INTERPRETATION: IMMUNOFIX ELECTR INT: NOT DETECTED

## 2018-04-07 NOTE — Telephone Encounter (Signed)
-----  Message from Ofilia Neas, PA-C sent at 04/07/2018  4:36 PM EDT ----- Orlean Patten,   I am Dr. Arlean Hopping rheumatology PA, and I am reaching out because you recently referred Madelaine Etienne to be evaluated by Dr. Estanislado Pandy. Timothea recently finished a prednisone taper prior to her visit, and she had no synovitis on exam when evaluated by Dr. Estanislado Pandy. Dr. Estanislado Pandy did not feel she had any clinical signs of autoimmune disease upon evaluation. Dr. Estanislado Pandy is concerned about the origin of her elevated sed rate. She typically does not see such a significant elevation in ESR in patients with positive RF. She feels malignancy should be ruled out as the cause of the elevated ESR, and she would like Chimamanda to follow back up with you.  Please call our office if you have any further questions! [phone]  Thank you for your time and for the referral!  Hazel Sams, PA-C

## 2018-04-08 NOTE — Telephone Encounter (Signed)
Please contact pt and let her know that rheumatology is recommending further evaluation to rule out other cause besides autoimmune for her elevated ESR.  I would like her to return for some follow up labs (pended), and I would also like to refer her to hematology.

## 2018-04-08 NOTE — Telephone Encounter (Signed)
Notified pt and she voices understanding and is agreeable to proceed with referral. Lab appt scheduled for 04/16/18 at 8:40am as pt is currently out of town. Pt will let us know if she has not been contacted within 1 week to schedule hematology consult.

## 2018-04-11 ENCOUNTER — Telehealth: Payer: Self-pay | Admitting: *Deleted

## 2018-04-11 NOTE — Telephone Encounter (Signed)
Notified pt of below.  Pt gets mirena changed with GYN. ? Rx should not have been sent by [place]. Will disregard.  Copied from CRM [id number]. Topic: General - Other >> Apr 11, 2018  2:19 PM Elliot Gault wrote: Caller name: Charlene  Relation to pt: [facility] associated with [facility]  Call back number: [phone] fax # [phone]   Reason for call:   Inquiring if patient is still interested in Mirena. Rx was received but pharmacy is not in network, please advise  >> Apr 11, 2018  2:25 PM Elliot Gault wrote: Jethro Bolus name: Charlene  Relation to pt: [facility] associated with [facility]  Call back number: [phone] fax # [phone]   Reason for call:   Inquiring if patient is still interested in Mirena. Rx was received but pharmacy is not in network, please advise

## 2018-04-16 ENCOUNTER — Other Ambulatory Visit: Payer: No Typology Code available for payment source

## 2018-04-16 ENCOUNTER — Other Ambulatory Visit (INDEPENDENT_AMBULATORY_CARE_PROVIDER_SITE_OTHER): Payer: No Typology Code available for payment source

## 2018-04-16 DIAGNOSIS — R700 Elevated erythrocyte sedimentation rate: Secondary | ICD-10-CM | POA: Diagnosis not present

## 2018-04-16 LAB — SEDIMENTATION RATE: Sed Rate: 73 mm/hr — ABNORMAL HIGH (ref 0–20)

## 2018-04-17 LAB — CBC (INCLUDES DIFF/PLT) WITH PATHOLOGIST REVIEW
BASOS PCT: 0.3 %
Basophils Absolute: 32 cells/uL (ref 0–200)
EOS ABS: 64 {cells}/uL (ref 15–500)
Eosinophils Relative: 0.6 %
HCT: 34.6 % — ABNORMAL LOW (ref 35.0–45.0)
Hemoglobin: 11.5 g/dL — ABNORMAL LOW (ref 11.7–15.5)
Lymphs Abs: 1834 cells/uL (ref 850–3900)
MCH: 26.1 pg — ABNORMAL LOW (ref 27.0–33.0)
MCHC: 33.2 g/dL (ref 32.0–36.0)
MCV: 78.5 fL — AB (ref 80.0–100.0)
MONOS PCT: 5.1 %
MPV: 8.9 fL (ref 7.5–12.5)
Neutro Abs: 8130 cells/uL — ABNORMAL HIGH (ref 1500–7800)
Neutrophils Relative %: 76.7 %
PLATELETS: 414 10*3/uL — AB (ref 140–400)
RBC: 4.41 10*6/uL (ref 3.80–5.10)
RDW: 13.3 % (ref 11.0–15.0)
Total Lymphocyte: 17.3 %
WBC mixed population: 541 cells/uL (ref 200–950)
WBC: 10.6 10*3/uL (ref 3.8–10.8)

## 2018-04-17 LAB — RETICULOCYTES
ABS Retic: 70400 cells/uL (ref 20000–8000)
RETIC CT PCT: 1.6 %

## 2018-04-21 ENCOUNTER — Other Ambulatory Visit: Payer: Self-pay

## 2018-04-21 DIAGNOSIS — L509 Urticaria, unspecified: Secondary | ICD-10-CM

## 2018-04-21 DIAGNOSIS — Z8709 Personal history of other diseases of the respiratory system: Secondary | ICD-10-CM | POA: Insufficient documentation

## 2018-04-21 DIAGNOSIS — D649 Anemia, unspecified: Secondary | ICD-10-CM

## 2018-04-21 DIAGNOSIS — M170 Bilateral primary osteoarthritis of knee: Secondary | ICD-10-CM | POA: Insufficient documentation

## 2018-04-21 DIAGNOSIS — M2240 Chondromalacia patellae, unspecified knee: Secondary | ICD-10-CM | POA: Insufficient documentation

## 2018-04-21 HISTORY — DX: Urticaria, unspecified: L50.9

## 2018-04-21 NOTE — Progress Notes (Deleted)
Office Visit Note  Patient: [name]             Date of Birth: 05-26-1986           MRN: [id number]             PCP: Debbrah Alar, NP Referring: Debbrah Alar, NP Visit Date: 04/30/2018 Occupation: '@GUAROCC'$ @    Subjective:  No chief complaint on file.   History of Present Illness: [name] is a 32 y.o. female ***   Activities of Daily Living:  Patient reports morning stiffness for *** {minute/hour:19697}.   Patient {ACTIONS;DENIES/REPORTS:21021675::"Denies"} nocturnal pain.  Difficulty dressing/grooming: {ACTIONS;DENIES/REPORTS:21021675::"Denies"} Difficulty climbing stairs: {ACTIONS;DENIES/REPORTS:21021675::"Denies"} Difficulty getting out of chair: {ACTIONS;DENIES/REPORTS:21021675::"Denies"} Difficulty using hands for taps, buttons, cutlery, and/or writing: {ACTIONS;DENIES/REPORTS:21021675::"Denies"}   No Rheumatology ROS completed.   PMFS History:  Patient Active Problem List   Diagnosis Date Noted  . CANDIDIASIS, VAGINAL 08/09/2010  . OBESITY 08/09/2010  . ASTHMA, CHILDHOOD 08/09/2010  . BREAST TENDERNESS 08/09/2010    Past Medical History:  Diagnosis Date  . Asthma   . Urticaria     Family History  Problem Relation Age of Onset  . Fibromyalgia Mother   . Deep vein thrombosis Mother        recurrent dvt  . Eczema Maternal Uncle   . Migraines Sister   . Healthy Son   . Healthy Daughter   . Other Neg Hx    Past Surgical History:  Procedure Laterality Date  . CESAREAN SECTION  2009  . 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       Objective: Vital Signs: There were no vitals taken for this visit.    Physical Exam   Musculoskeletal Exam: ***  CDAI Exam: No CDAI exam completed.    Investigation: No additional findings. CBC Latest Ref Rng & Units 04/16/2018 04/02/2018 07/20/2014  WBC 3.8 - 10.8 Thousand/uL 10.6 15.7(H) 6.4  Hemoglobin 11.7 - 15.5 g/dL 11.5(L) 12.4 14.3  Hematocrit 35.0 - 45.0 % 34.6(L) 38.6 42.4  Platelets 140 - 400 Thousand/uL 414(H) 549(H) 300  April 02, 2018 CMP normal, UA negative, IFE negative, HIV negative, hepatitis B negative, hepatitis C negative, immunoglobulins normal, TB Gold negative, G6PD normal ANCA negative, ENA negative, C3-4 normal, anti-CCP negative, ESR 125 Apr 16, 2018 ESR 73   Imaging: Xr Foot 2 Views Left  Result Date: 04/02/2018 Minimal first MTP narrowing was noted.  No PIP/DIP or intertarsal joint space narrowing was noted.  No erosive changes were noted.  No tibiotalar joint space narrowing was noted. Unremarkable x-ray of the foot.  Xr Foot 2 Views Right  Result Date: 04/02/2018 Minimal first MTP narrowing was noted.  No PIP/DIP or intertarsal joint space narrowing was noted.  No erosive changes were noted.  No tibiotalar joint space narrowing was noted. Unremarkable x-ray of the foot.  Xr Hand 2 View Left  Result Date: 04/02/2018 No MCP, PIP, DIP or intercarpal joint space narrowing was noted.  No erosive changes were noted. Impression: Unremarkable x-ray of the hand.  Xr Hand 2 View Right  Result Date: 04/02/2018 No MCP, PIP, DIP or  intercarpal joint space narrowing was noted.  No erosive changes were noted. Impression: Unremarkable x-ray of the hand.  Xr Knee 3 View Left  Result Date: 04/02/2018 Moderate medial compartment narrowing noted.  No chondrocalcinosis noted.  Severe patellofemoral narrowing was noted. 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  Xr Knee 3 View Right  Result Date: 04/02/2018 Moderate medial compartment narrowing noted.  No chondrocalcinosis noted.  Severe  patellofemoral narrowing was noted. 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   Speciality Comments: No specialty comments available.    Procedures:  No procedures performed Allergies: Patient has no known allergies.   Assessment / Plan:     Visit Diagnoses: No diagnosis found.    Orders: No orders of the defined types were placed in this encounter.  No orders of the defined types were placed in this encounter.   Face-to-face time spent with patient was *** minutes. 50% of time was spent in counseling and coordination of care.  Follow-Up Instructions: No follow-ups on file.   Bo Merino, MD  Note - This record has been created using Editor, commissioning.  Chart creation errors have been sought, but may not always  have been located. Such creation errors do not reflect on  the standard of medical care.

## 2018-04-23 ENCOUNTER — Encounter: Payer: No Typology Code available for payment source | Admitting: Family

## 2018-04-30 ENCOUNTER — Ambulatory Visit: Payer: 59 | Admitting: Rheumatology

## 2018-05-01 ENCOUNTER — Other Ambulatory Visit: Payer: No Typology Code available for payment source

## 2018-05-02 ENCOUNTER — Encounter: Payer: No Typology Code available for payment source | Admitting: Family

## 2018-05-02 ENCOUNTER — Other Ambulatory Visit (INDEPENDENT_AMBULATORY_CARE_PROVIDER_SITE_OTHER): Payer: No Typology Code available for payment source

## 2018-05-02 DIAGNOSIS — D649 Anemia, unspecified: Secondary | ICD-10-CM | POA: Diagnosis not present

## 2018-05-02 NOTE — Addendum Note (Signed)
Addended by: Harley Alto on: 05/02/2018 03:17 PM   Modules accepted: Orders

## 2018-05-03 LAB — IRON: Iron: 13 ug/dL — ABNORMAL LOW (ref 40–190)

## 2018-05-03 LAB — FERRITIN: FERRITIN: 770 ng/mL — AB (ref 10–154)

## 2018-05-05 ENCOUNTER — Telehealth: Payer: Self-pay | Admitting: Family

## 2018-05-05 DIAGNOSIS — R7989 Other specified abnormal findings of blood chemistry: Secondary | ICD-10-CM

## 2018-05-05 NOTE — Telephone Encounter (Signed)
Please check that pt received mychart message. If not, please call her.

## 2018-05-06 NOTE — Telephone Encounter (Signed)
Notified pt. She states we had already referred her to hematology and she has an upcoming appt on 05/14/18. Reviewed referral and see that referrals were for 2 different abnormal labs. Apologized to pt for duplication and that hematology should be able to address both concerns at her consultation. Pt voices understanding.

## 2018-05-06 NOTE — Addendum Note (Signed)
Addended by: Sandford Craze on: 05/06/2018 09:47 AM   Modules accepted: Orders

## 2018-05-07 ENCOUNTER — Ambulatory Visit (INDEPENDENT_AMBULATORY_CARE_PROVIDER_SITE_OTHER): Payer: Self-pay

## 2018-05-07 ENCOUNTER — Ambulatory Visit (INDEPENDENT_AMBULATORY_CARE_PROVIDER_SITE_OTHER): Payer: No Typology Code available for payment source | Admitting: Rheumatology

## 2018-05-07 DIAGNOSIS — M79641 Pain in right hand: Secondary | ICD-10-CM | POA: Diagnosis not present

## 2018-05-07 DIAGNOSIS — M79642 Pain in left hand: Secondary | ICD-10-CM | POA: Diagnosis not present

## 2018-05-08 ENCOUNTER — Ambulatory Visit: Payer: No Typology Code available for payment source | Admitting: Family Medicine

## 2018-05-13 ENCOUNTER — Other Ambulatory Visit: Payer: Self-pay | Admitting: Family

## 2018-05-13 DIAGNOSIS — D509 Iron deficiency anemia, unspecified: Secondary | ICD-10-CM

## 2018-05-14 ENCOUNTER — Inpatient Hospital Stay: Payer: No Typology Code available for payment source | Attending: Family | Admitting: Family

## 2018-05-14 ENCOUNTER — Inpatient Hospital Stay: Payer: No Typology Code available for payment source

## 2018-05-14 ENCOUNTER — Encounter: Payer: Self-pay | Admitting: Family

## 2018-05-14 ENCOUNTER — Other Ambulatory Visit: Payer: Self-pay

## 2018-05-14 DIAGNOSIS — D509 Iron deficiency anemia, unspecified: Secondary | ICD-10-CM

## 2018-05-14 DIAGNOSIS — R7989 Other specified abnormal findings of blood chemistry: Secondary | ICD-10-CM | POA: Diagnosis not present

## 2018-05-14 DIAGNOSIS — D500 Iron deficiency anemia secondary to blood loss (chronic): Secondary | ICD-10-CM

## 2018-05-14 LAB — CBC WITH DIFFERENTIAL (CANCER CENTER ONLY)
Basophils Absolute: 0 10*3/uL (ref 0.0–0.1)
Basophils Relative: 0 %
Eosinophils Absolute: 0.1 10*3/uL (ref 0.0–0.5)
Eosinophils Relative: 1 %
HEMATOCRIT: 35.2 % (ref 34.8–46.6)
HEMOGLOBIN: 11.4 g/dL — AB (ref 11.6–15.9)
LYMPHS ABS: 2.2 10*3/uL (ref 0.9–3.3)
LYMPHS PCT: 23 %
MCH: 24.9 pg — AB (ref 26.0–34.0)
MCHC: 32.4 g/dL (ref 32.0–36.0)
MCV: 76.9 fL — AB (ref 81.0–101.0)
Monocytes Absolute: 0.5 10*3/uL (ref 0.1–0.9)
Monocytes Relative: 5 %
NEUTROS PCT: 71 %
Neutro Abs: 6.6 10*3/uL — ABNORMAL HIGH (ref 1.5–6.5)
Platelet Count: 494 10*3/uL — ABNORMAL HIGH (ref 145–400)
RBC: 4.58 MIL/uL (ref 3.70–5.32)
RDW: 13.8 % (ref 11.1–15.7)
WBC: 9.3 10*3/uL (ref 3.9–10.0)

## 2018-05-14 LAB — CMP (CANCER CENTER ONLY)
ALK PHOS: 62 U/L (ref 26–84)
ALT: 20 U/L (ref 10–47)
ANION GAP: 7 (ref 5–15)
AST: 20 U/L (ref 11–38)
Albumin: 3.2 g/dL — ABNORMAL LOW (ref 3.5–5.0)
BUN: 7 mg/dL (ref 7–22)
CO2: 25 mmol/L (ref 18–33)
Calcium: 9.3 mg/dL (ref 8.0–10.3)
Chloride: 106 mmol/L (ref 98–108)
Creatinine: 0.5 mg/dL — ABNORMAL LOW (ref 0.60–1.20)
Glucose, Bld: 88 mg/dL (ref 73–118)
POTASSIUM: 4.3 mmol/L (ref 3.3–4.7)
Sodium: 138 mmol/L (ref 128–145)
Total Bilirubin: 0.8 mg/dL (ref 0.2–1.6)
Total Protein: 7.6 g/dL (ref 6.4–8.1)

## 2018-05-14 LAB — SAVE SMEAR

## 2018-05-14 NOTE — Progress Notes (Addendum)
Hematology/Oncology Consultation   Name: [name]      MRN: [id number]    Location: Room/bed info not found  Date: 05/14/2018 Time:7:15 PM   REFERRING PHYSICIAN: Sandford Craze, NP  REASON FOR CONSULT: Elevated ferritin   DIAGNOSIS: Iron deficiency anemia   HISTORY OF PRESENT ILLNESS: [name] is a very pleasant 32 yo African American female with elevated ferritin and low iron.  She is symptomatic with fatigue, weakness, SOB with or without exertion.  She has an IUD and does not have a cycles. She has had no episodes of bleeding, bruising or petechiae.  She has 2 children and no history of miscarriage.  She has an elevated sed rate has been elevated, her last result was 73. This could certainly contribute to an elevated ferritin level.  She is seeing rheumatologist Dr. Corliss Skains for a lingering raised red rash on her arms and legs. This does itch. She also has generalized joint aches and pains.  She has seen dermatology and they were unsure as to what the rash was. She has not had a biopsy yet.  No person history of sickle cell disease or trait. She has a maternal cousin with sickle cell disease.  No family history of anemia.  No personal history of cancer. Her maternal grandfather had an unknown primary.  No fever, chills, n/v, cough, dizziness, chest pain, palpitations, abdominal pain or changes in bowel or bladder habits.  She has occasional puffiness that comes and goes in her ankles. No numbness or tingling in her extremities.  Her appetite is down and she states that she needs to better hydrated. Her weight is stable.  She is not a smoker and only occasionally has an alcoholic beverage socially.  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   ROS: All other 10 point review of systems is negative.   PAST MEDICAL HISTORY:   Past Medical History:  Diagnosis Date  . Asthma   . Urticaria     ALLERGIES: No Known Allergies    MEDICATIONS:  Current Outpatient Medications on File  Prior to Visit  Medication Sig Dispense Refill  . albuterol (PROVENTIL HFA;VENTOLIN HFA) 108 (90 Base) MCG/ACT inhaler Inhale 2 puffs into the lungs every 4 (four) hours as needed for wheezing or shortness of breath. Asthma attacks, emergency use only. (Patient not taking: Reported on 04/02/2018) 1 Inhaler 4  . cetirizine (ZYRTEC) 10 MG tablet Take 1 tablet (10 mg total) by mouth 2 (two) times daily. (Patient not taking: Reported on 05/14/2018) 60 tablet 5  . levonorgestrel (MIRENA, 52 MG,) 20 MCG/24HR IUD 1 Intra Uterine Device (1 each total) by Intrauterine route once for 1 dose. 1 each 0  . nystatin cream (MYCOSTATIN) Apply to affected area 2 times daily (Patient not taking: Reported on 04/02/2018) 15 g 0  . phentermine (ADIPEX-P) 37.5 MG tablet Take 7.5 mg by mouth daily.  1  . predniSONE (DELTASONE) 10 MG tablet Take 1 tablet (10 mg total) by mouth daily with breakfast. 20 tablet 0  . Prenatal Vit-Fe Fumarate-FA (PRENATAL MULTIVITAMIN) TABS Take 1 tablet by mouth at bedtime.    . ranitidine (ZANTAC) 150 MG tablet Take 1 tablet (150 mg total) by mouth 2 (two) times daily. (Patient taking differently: Take 150 mg by mouth as needed. ) 60 tablet 4   No current facility-administered medications on file prior to visit.      PAST SURGICAL HISTORY Past Surgical History:  Procedure Laterality Date  . CESAREAN SECTION  2009  . CESAREAN SECTION  08/21/2012   Procedure: CESAREAN SECTION;  Surgeon: Loney Laurence, MD;  Location: [facility];  Service: Obstetrics;  Laterality: N/A;    FAMILY HISTORY: Family History  Problem Relation Age of Onset  . Fibromyalgia Mother   . Deep vein thrombosis Mother        recurrent dvt  . Eczema Maternal Uncle   . Migraines Sister   . Healthy Son   . Healthy Daughter   . Other Neg Hx     SOCIAL HISTORY:  reports that she has never smoked. She has never used smokeless tobacco. She reports that she drinks alcohol. She reports that she does not use  drugs.  PERFORMANCE STATUS: The patient's performance status is 1 - Symptomatic but completely ambulatory  PHYSICAL EXAM: Most Recent Vital Signs: Blood pressure 121/66, pulse 92, temperature 98.2 F (36.8 C), temperature source Oral, resp. rate 20, weight 184 lb 6.4 oz (83.6 kg), SpO2 100 %, unknown if currently breastfeeding. BP 121/66 (BP Location: Left Arm, Patient Position: Sitting)   Pulse 92   Temp 98.2 F (36.8 C) (Oral)   Resp 20   Wt 184 lb 6.4 oz (83.6 kg)   SpO2 100%   BMI 38.54 kg/m   General Appearance:    Alert, cooperative, no distress, appears stated age  Head:    Normocephalic, without obvious abnormality, atraumatic  Eyes:    PERRL, conjunctiva/corneas clear, EOM's intact, fundi    benign, both eyes        Throat:   Lips, mucosa, and tongue normal; teeth and gums normal  Neck:   Supple, symmetrical, trachea midline, no adenopathy;    thyroid:  no enlargement/tenderness/nodules; no carotid   bruit or JVD  Back:     Symmetric, no curvature, ROM normal, no CVA tenderness  Lungs:     Clear to auscultation bilaterally, respirations unlabored  Chest Wall:    No tenderness or deformity   Heart:    Regular rate and rhythm, S1 and S2 normal, no murmur, rub   or gallop     Abdomen:     Soft, non-tender, bowel sounds active all four quadrants,    no masses, no organomegaly        Extremities:   Extremities normal, atraumatic, no cyanosis or edema  Pulses:   2+ and symmetric all extremities  Skin:   Skin color, texture, turgor normal, no rashes or lesions  Lymph nodes:   Cervical, supraclavicular, and axillary nodes normal  Neurologic:   CNII-XII intact, normal strength, sensation and reflexes    throughout    LABORATORY DATA:  Results for orders placed or performed in visit on 05/14/18 (from the past 48 hour(s))  CBC with Differential (Cancer Center Only)     Status: Abnormal   Collection Time: 05/14/18  2:40 PM  Result Value Ref Range   WBC Count 9.3 3.9 -  10.0 K/uL   RBC 4.58 3.70 - 5.32 MIL/uL   Hemoglobin 11.4 (L) 11.6 - 15.9 g/dL   HCT 61.2 24.4 - 97.5 %   MCV 76.9 (L) 81.0 - 101.0 fL   MCH 24.9 (L) 26.0 - 34.0 pg   MCHC 32.4 32.0 - 36.0 g/dL   RDW 30.0 51.1 - 02.1 %   Platelet Count 494 (H) 145 - 400 K/uL   Neutrophils Relative % 71 %   Neutro Abs 6.6 (H) 1.5 - 6.5 K/uL   Lymphocytes Relative 23 %   Lymphs Abs 2.2 0.9 -  3.3 K/uL   Monocytes Relative 5 %   Monocytes Absolute 0.5 0.1 - 0.9 K/uL   Eosinophils Relative 1 %   Eosinophils Absolute 0.1 0.0 - 0.5 K/uL   Basophils Relative 0 %   Basophils Absolute 0.0 0.0 - 0.1 K/uL    Comment: Performed at [facility] at [facility], [address] [place], [place] [address]  CMP (Cancer Center only)     Status: Abnormal   Collection Time: 05/14/18  2:40 PM  Result Value Ref Range   Sodium 138 128 - 145 mmol/L   Potassium 4.3 3.3 - 4.7 mmol/L   Chloride 106 98 - 108 mmol/L   CO2 25 18 - 33 mmol/L   Glucose, Bld 88 73 - 118 mg/dL   BUN 7 7 - 22 mg/dL   Creatinine 8.67 (L) 6.72 - 1.20 mg/dL   Calcium 9.3 8.0 - 09.4 mg/dL   Total Protein 7.6 6.4 - 8.1 g/dL   Albumin 3.2 (L) 3.5 - 5.0 g/dL   AST 20 11 - 38 U/L   ALT 20 10 - 47 U/L   Alkaline Phosphatase 62 26 - 84 U/L   Total Bilirubin 0.8 0.2 - 1.6 mg/dL   Anion gap 7 5 - 15    Comment: Performed at [facility] at [facility], [address], [place], [place] [address]  Save smear     Status: None   Collection Time: 05/14/18  2:40 PM  Result Value Ref Range   Smear Review SMEAR STAINED AND AVAILABLE FOR REVIEW     Comment: Performed at [facility] at [facility], [address], [place], [place] [address]      RADIOGRAPHY: No results found.     PATHOLOGY: None    ASSESSMENT/PLAN: [name] is a very pleasant 32 yo African American female with elevated ferritin and low iron.  We will see what her iron studies show and determine if she  needs an oral iron supplement vs. IV iron. She is currently being worked up by rheumatology for a possible autoimmune disease.  We will get CT scans within the next week to further evaluate for possible malignancy.  We will wait for her results and then determine a treatment plan and schedule her follow-up.    All questions were answered and she is in agreement with the plan. She will contact our office with any questions or concerns. We can certainly see her sooner if needed.  She was discussed with and also seen by Dr. Myna Hidalgo and he is in agreement with the aforementioned.   Emeline Gins     Addendum: I saw and examined the patient with Maralyn Sago.  I agree with the above assessment.  I just do not see that we are going to have any issues with her.  She is iron deficient.  The ferritin being elevated as an acute inflammatory response.  I think it is apparent that she probably has some collagen vascular issue.  I do not know if she has some kind of vasculitis.  May be, this rash that she has needs to be biopsied.  Outside of iron deficiency, I do not see any evidence of a hematologic malignancy.  Just because her sedimentation rate is elevated does not indicate any malignancy from my perspective.  I think that it would not be a bad idea for her to have scans done.  I think if  there is any question of a malignancy, CAT scans would not be a bad idea.  We will see about getting these set up for her.  I think this would give her some reassurance.  I looked at her blood under the microscope.  I do not see anything that looked significant under the microscope.  Her white cells appeared normal.  She had normal platelets.  She had no abnormalities with her red blood cells.  We will see what the scans show.  We spent about 45 minutes with she and her husband.  We spent the time counseling them.  We went over her lab report.  We have reassured them that we just did not think that there was any  hematologic malignancy that we would have to deal with.  Christin Bach, MD

## 2018-05-15 ENCOUNTER — Encounter: Payer: Self-pay | Admitting: Family

## 2018-05-15 ENCOUNTER — Telehealth: Payer: Self-pay | Admitting: Hematology & Oncology

## 2018-05-15 DIAGNOSIS — D500 Iron deficiency anemia secondary to blood loss (chronic): Secondary | ICD-10-CM | POA: Insufficient documentation

## 2018-05-15 HISTORY — DX: Iron deficiency anemia secondary to blood loss (chronic): D50.0

## 2018-05-15 LAB — IRON AND TIBC
Iron: 34 ug/dL — ABNORMAL LOW (ref 41–142)
Saturation Ratios: 15 % — ABNORMAL LOW (ref 21–57)
TIBC: 229 ug/dL — ABNORMAL LOW (ref 236–444)
UIBC: 195 ug/dL

## 2018-05-15 LAB — LACTATE DEHYDROGENASE: LDH: 225 U/L (ref 125–245)

## 2018-05-15 LAB — FERRITIN: FERRITIN: 488 ng/mL — AB (ref 9–269)

## 2018-05-15 NOTE — Telephone Encounter (Signed)
No los for 6/5 visit

## 2018-05-16 ENCOUNTER — Other Ambulatory Visit: Payer: Self-pay | Admitting: Family

## 2018-05-16 DIAGNOSIS — R700 Elevated erythrocyte sedimentation rate: Secondary | ICD-10-CM

## 2018-05-16 DIAGNOSIS — R0602 Shortness of breath: Secondary | ICD-10-CM

## 2018-05-16 DIAGNOSIS — R531 Weakness: Secondary | ICD-10-CM

## 2018-05-16 DIAGNOSIS — R7989 Other specified abnormal findings of blood chemistry: Secondary | ICD-10-CM

## 2018-05-19 DIAGNOSIS — R700 Elevated erythrocyte sedimentation rate: Secondary | ICD-10-CM | POA: Insufficient documentation

## 2018-05-19 NOTE — Progress Notes (Signed)
Office Visit Note  Patient: [name]             Date of Birth: 12-11-1985           MRN: [id number]             PCP: Debbrah Alar, NP Referring: Debbrah Alar, NP Visit Date: 05/27/2018 Occupation: _0 @    Subjective:  Arthralgias, rash and elevated ESR.   History of Present Illness: [name] is a 32 y.o. female with history of polyarthralgias and elevated ESR.  Patient states that she continues to have some discomfort in her joints.  She denies any joint swelling.  She also has been having rash which comes and goes.  Recently the rash has been worse and has been more constant.  She states the rashes on her extremities, trunk and her face.  She also had hematology work-up by Dr. Marin Olp who did CT scan of the chest and abdomen which was unremarkable except for some axillary lymph nodes.  She was diagnosed with iron deficiency anemia.  She was given iron infusion yesterday.  Activities of Daily Living:  Patient reports morning stiffness for 30 minutes.   Patient Reports nocturnal pain.  Difficulty dressing/grooming: Reports Difficulty climbing stairs: Reports Difficulty getting out of chair: Denies Difficulty using hands for taps, buttons, cutlery, and/or writing: Reports   Review of Systems  Constitutional: Positive for fatigue. Negative for night sweats, weight gain and weight loss.  HENT: Negative for mouth sores, trouble swallowing, trouble swallowing, mouth dryness and nose dryness.   Eyes: Negative for pain, redness, visual disturbance and dryness.  Respiratory: Negative for cough, shortness of breath and difficulty breathing.   Cardiovascular: Negative for chest pain, palpitations, hypertension, irregular heartbeat and swelling in legs/feet.  Gastrointestinal: Negative for abdominal pain, blood in stool, constipation and diarrhea.  Endocrine: Negative for increased urination.  Genitourinary: Negative for pelvic pain and vaginal dryness.    Musculoskeletal: Positive for arthralgias, joint pain and morning stiffness. Negative for joint swelling, myalgias, muscle weakness, muscle tenderness and myalgias.  Skin: Positive for rash. Negative for color change, hair loss, skin tightness, ulcers and sensitivity to sunlight.  Allergic/Immunologic: Negative for susceptible to infections.  Neurological: Negative for dizziness, light-headedness, headaches, memory loss, night sweats and weakness.  Hematological: Negative for bruising/bleeding tendency and swollen glands.  Psychiatric/Behavioral: Negative for depressed mood, confusion and sleep disturbance. The patient is not nervous/anxious.     PMFS History:  Patient Active Problem List   Diagnosis Date Noted  . Elevated sed rate 05/19/2018  . Iron deficiency anemia due to chronic blood loss 05/15/2018  . Primary osteoarthritis of both knees 04/21/2018  . Chondromalacia of patella 04/21/2018  . Urticaria 04/21/2018  . History of asthma 04/21/2018  . CANDIDIASIS, VAGINAL 08/09/2010  . OBESITY 08/09/2010  . ASTHMA, CHILDHOOD 08/09/2010  . BREAST TENDERNESS 08/09/2010    Past Medical History:  Diagnosis Date  . Asthma   . Iron deficiency anemia due to chronic blood loss 05/15/2018  . Urticaria     Family History  Problem Relation Age of Onset  . Fibromyalgia Mother   . Deep vein thrombosis Mother        recurrent dvt  . Eczema Maternal Uncle   . Migraines Sister   . Healthy Son   . Healthy Daughter   . Other Neg Hx    Past Surgical History:  Procedure Laterality Date  . CESAREAN SECTION  2009  . 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        Objective: Vital Signs: BP 122/74 (BP Location: Left Arm, Patient Position: Sitting, Cuff Size: Normal)   Pulse 68   Resp 12   Ht _0  (1.473 m)   Wt 187 lb (84.8 kg)   BMI 39.08 kg/m    Physical Exam  Constitutional: She is oriented to person, place, and time. She appears well-developed and well-nourished.  HENT:  Head: Normocephalic and atraumatic.  Eyes: Conjunctivae and EOM are normal.  Neck: Normal range of motion.  Cardiovascular: Normal rate, regular rhythm, normal heart sounds and intact distal pulses.  Pulmonary/Chest: Effort normal and breath sounds normal.  Abdominal: Soft. Bowel sounds are normal.  Lymphadenopathy:    She has no cervical adenopathy.  Neurological: She is alert and oriented to person, place, and time.  Skin: Skin is warm and dry. Capillary refill takes less than 2 seconds. Rash noted.  Maculopapular rash on face, trunk and extremities  Psychiatric: She has a normal mood and affect. Her behavior is normal.  Nursing note and vitals reviewed.    Musculoskeletal Exam: C-spine, thoracic and lumbar spine good range of motion.  Shoulder joints, elbow joints, wrist joints, MCPs PIPs DIPs were in good range of motion with no synovitis.  She has a palpable nodule present over the right third PIP joint.  Hip joints, knee joints, ankles, MTPs PIPs were in good range of motion with no synovitis.  CDAI Exam: No CDAI exam completed.    Investigation: No additional findings. April 02, 2018 CBC WBC 15.7, CMP normal, UA negative, IFE negative, TB Gold negative, immunoglobulins normal, hepatitis B-, hepatitis C negative, HIV negative G6PD normal  ANCA negative C3-C4 normal, ESR 125, anti-CCP negative, ENA negative  Apr 16, 2018 ESR 73, WBC 10.6, iron saturation 15%, ferritin 488, LDH 255  imaging: Ct Chest W Contrast  Result Date: 05/21/2018 CLINICAL DATA:  Two year history of joint pain, rash, shortness of breath, and weakness. Recent elevation of  erythrocyte sedimentation rate. EXAM: CT CHEST, ABDOMEN, AND PELVIS WITH CONTRAST TECHNIQUE: Multidetector CT imaging of the chest, abdomen and pelvis was performed following the standard protocol during bolus administration of intravenous contrast. CONTRAST:  165m ISOVUE-300 IOPAMIDOL (ISOVUE-300) INJECTION 61% COMPARISON:  Chest radiograph 02/19/2018 FINDINGS: CT CHEST FINDINGS Cardiovascular: Unremarkable Mediastinum/Nodes: Numerous scattered small axillary lymph nodes are present. One of the larger axillary nodes measures 1.0 cm in the right axilla on image 14/2. Residual thymic tissue noted. Small type 3 hiatal hernia. Lungs/Pleura: Unremarkable Musculoskeletal: Unremarkable CT ABDOMEN PELVIS FINDINGS Hepatobiliary: Unremarkable Pancreas: Unremarkable Spleen: Unremarkable Adrenals/Urinary Tract: Nonspecific hypodense 1.2 by 0.6 by 0.7 cm lesion of the left kidney lower pole anteriorly on image 62/2. Adrenal glands normal.  No stones are identified. Stomach/Bowel: Prominent stool throughout the colon favors constipation. The cecum resides in the left lower quadrant. Appendix not well seen. Vascular/Lymphatic: No abnormal arterial wall thickening. Scattered small external iliac and inguinal lymph nodes are present. Reproductive: IUD satisfactorily positioned in the endometrium. Adnexa unremarkable. Other: No supplemental non-categorized findings. Musculoskeletal: Sacroiliac joints unremarkable. No significant abnormality observed. IMPRESSION: 1.  Prominent stool throughout the  colon favors constipation. 2. Scattered small axillary lymph nodes may be reactive but are technically nonspecific. 3. Small hypodense left kidney lower pole lesion is probably a cyst but technically nonspecific due to small size. 4. Small type 3 hiatal hernia. Electronically Signed   By: Van Clines M.D.   On: 05/21/2018 11:49   Ct Abdomen Pelvis W Contrast  Result Date: 05/21/2018 CLINICAL DATA:  Two year history of joint  pain, rash, shortness of breath, and weakness. Recent elevation of erythrocyte sedimentation rate. EXAM: CT CHEST, ABDOMEN, AND PELVIS WITH CONTRAST TECHNIQUE: Multidetector CT imaging of the chest, abdomen and pelvis was performed following the standard protocol during bolus administration of intravenous contrast. CONTRAST:  134m ISOVUE-300 IOPAMIDOL (ISOVUE-300) INJECTION 61% COMPARISON:  Chest radiograph 02/19/2018 FINDINGS: CT CHEST FINDINGS Cardiovascular: Unremarkable Mediastinum/Nodes: Numerous scattered small axillary lymph nodes are present. One of the larger axillary nodes measures 1.0 cm in the right axilla on image 14/2. Residual thymic tissue noted. Small type 3 hiatal hernia. Lungs/Pleura: Unremarkable Musculoskeletal: Unremarkable CT ABDOMEN PELVIS FINDINGS Hepatobiliary: Unremarkable Pancreas: Unremarkable Spleen: Unremarkable Adrenals/Urinary Tract: Nonspecific hypodense 1.2 by 0.6 by 0.7 cm lesion of the left kidney lower pole anteriorly on image 62/2. Adrenal glands normal.  No stones are identified. Stomach/Bowel: Prominent stool throughout the colon favors constipation. The cecum resides in the left lower quadrant. Appendix not well seen. Vascular/Lymphatic: No abnormal arterial wall thickening. Scattered small external iliac and inguinal lymph nodes are present. Reproductive: IUD satisfactorily positioned in the endometrium. Adnexa unremarkable. Other: No supplemental non-categorized findings. Musculoskeletal: Sacroiliac joints unremarkable. No significant abnormality observed. IMPRESSION: 1.  Prominent stool throughout the colon favors constipation. 2. Scattered small axillary lymph nodes may be reactive but are technically nonspecific. 3. Small hypodense left kidney lower pole lesion is probably a cyst but technically nonspecific due to small size. 4. Small type 3 hiatal hernia. Electronically Signed   By: WVan ClinesM.D.   On: 05/21/2018 11:49   UKoreaExtrem Up Bilat Comp  Result  Date: 05/07/2018 Ultrasound examination of bilateral hands was performed per EULAR recommendations. Using 12 MHz transducer, grayscale and power Doppler bilateral second, third, and fifth MCP joints and bilateral wrist joints both dorsal and volar aspects were evaluated to look for synovitis or tenosynovitis. The findings were there was no synovitis or tenosynovitis on ultrasound examination. Right median nerve was 0.13 cm squares which was more than upper limits of normal and left median nerve was 0.13 cm squares which was more than upper limits of normal. Impression: Ultrasound examination did not show any synovitis or tenosynovitis.  Bilateral median nerves were mildly enlarged.   Speciality Comments: No specialty comments available.    Procedures:  No procedures performed Allergies: Patient has no known allergies.   Assessment / Plan:     Visit Diagnoses: Polyarthralgia - History of migratory polyarthralgia.RF44 all other autoimmune work-up negative.  She has elevated ESR.  Ultrasound negative for synovitis.  Patient still complains of polyarthralgia which comes and goes.  She has no synovitis on examination.  She denies any joint swelling.  I am uncertain about the etiology of her elevated sedimentation rate.  I will refer her to rheumatology for second opinion.  Primary osteoarthritis of both knees - Bilateral moderate with severe chondromalacia patella.  She continues to have some knee joint discomfort.  Urticaria - Patient brought pictures of urticaria on her face and extremities.  Patient had rash on her face, extremities and her trunk today.  She states she has had  recurrent rash for 2 years now.  It sometimes is pruritic.  She states the Zyrtec does not help and the Benadryl helps but it makes her drowsy.  She has seen dermatology in the past without results.  I will refer her to dermatology again for possible biopsy.  She also had a small nodule on her right third PIP joint which I would  like to have biopsy.  History of asthma-she has been followed by allergist.  Elevated sed rate - Oncology work-up negative per Dr. Marin Olp.  She had some axillary lymph nodes which Dr. Marin Olp felt were nonspecific.  Iron deficiency anemia due to chronic blood loss -she has been given iron infusion by Dr. Marin Olp.   Orders: Orders Placed This Encounter  Procedures  . Ambulatory referral to Rheumatology  . Ambulatory referral to Dermatology   No orders of the defined types were placed in this encounter.   Face-to-face time spent with patient was 30 minutes. >50% of time was spent in counseling and coordination of care.  Follow-Up Instructions: Return in about 2 months (around 07/27/2018) for Polyarthralgia, rash.   Bo Merino, MD  Note - This record has been created using Editor, commissioning.  Chart creation errors have been sought, but may not always  have been located. Such creation errors do not reflect on  the standard of medical care.

## 2018-05-20 ENCOUNTER — Ambulatory Visit (HOSPITAL_BASED_OUTPATIENT_CLINIC_OR_DEPARTMENT_OTHER): Payer: No Typology Code available for payment source

## 2018-05-21 ENCOUNTER — Ambulatory Visit (HOSPITAL_BASED_OUTPATIENT_CLINIC_OR_DEPARTMENT_OTHER)
Admission: RE | Admit: 2018-05-21 | Discharge: 2018-05-21 | Disposition: A | Discharge status: Home / Self Care | Payer: No Typology Code available for payment source | Source: Ambulatory Visit | Attending: Family | Admitting: Family

## 2018-05-21 ENCOUNTER — Encounter (HOSPITAL_BASED_OUTPATIENT_CLINIC_OR_DEPARTMENT_OTHER): Payer: Self-pay

## 2018-05-21 DIAGNOSIS — R9389 Abnormal findings on diagnostic imaging of other specified body structures: Secondary | ICD-10-CM | POA: Insufficient documentation

## 2018-05-21 DIAGNOSIS — N289 Disorder of kidney and ureter, unspecified: Secondary | ICD-10-CM | POA: Insufficient documentation

## 2018-05-21 DIAGNOSIS — K449 Diaphragmatic hernia without obstruction or gangrene: Secondary | ICD-10-CM | POA: Diagnosis not present

## 2018-05-21 DIAGNOSIS — R0602 Shortness of breath: Secondary | ICD-10-CM | POA: Diagnosis not present

## 2018-05-21 DIAGNOSIS — R7989 Other specified abnormal findings of blood chemistry: Secondary | ICD-10-CM

## 2018-05-21 DIAGNOSIS — R700 Elevated erythrocyte sedimentation rate: Secondary | ICD-10-CM | POA: Insufficient documentation

## 2018-05-21 DIAGNOSIS — R195 Other fecal abnormalities: Secondary | ICD-10-CM | POA: Insufficient documentation

## 2018-05-21 DIAGNOSIS — R531 Weakness: Secondary | ICD-10-CM | POA: Diagnosis not present

## 2018-05-21 MED ORDER — IOPAMIDOL (ISOVUE-300) INJECTION 61%
100.0000 mL | Freq: Once | INTRAVENOUS | Status: AC | PRN
  Administered 2018-05-21: 100 mL via INTRAVENOUS

## 2018-05-22 ENCOUNTER — Telehealth: Payer: Self-pay | Admitting: Family

## 2018-05-22 NOTE — Telephone Encounter (Signed)
I spoke with [name] and went over her CT scan results which showed some small reactive axillary nodes. She continues to be worked up by Dr. Corliss Skains for an autoimmune disease. She comes tomorrow for iron and we will continue to follow along with her.

## 2018-05-23 ENCOUNTER — Inpatient Hospital Stay: Payer: No Typology Code available for payment source

## 2018-05-23 ENCOUNTER — Other Ambulatory Visit: Payer: Self-pay

## 2018-05-23 DIAGNOSIS — D509 Iron deficiency anemia, unspecified: Secondary | ICD-10-CM | POA: Diagnosis not present

## 2018-05-23 DIAGNOSIS — D500 Iron deficiency anemia secondary to blood loss (chronic): Secondary | ICD-10-CM

## 2018-05-23 MED ORDER — SODIUM CHLORIDE 0.9 % IV SOLN
Freq: Once | INTRAVENOUS | Status: AC
  Administered 2018-05-23: 10:00:00 via INTRAVENOUS

## 2018-05-23 MED ORDER — SODIUM CHLORIDE 0.9 % IV SOLN
510.0000 mg | Freq: Once | INTRAVENOUS | Status: AC
  Administered 2018-05-23: 510 mg via INTRAVENOUS
  Filled 2018-05-23: qty 17

## 2018-05-23 NOTE — Patient Instructions (Signed)

## 2018-05-27 ENCOUNTER — Ambulatory Visit: Payer: No Typology Code available for payment source | Admitting: Rheumatology

## 2018-05-27 ENCOUNTER — Encounter: Payer: Self-pay | Admitting: Rheumatology

## 2018-05-27 VITALS — BP 122/74 | HR 68 | Resp 12 | Ht <= 58 in | Wt 187.0 lb

## 2018-05-27 DIAGNOSIS — D500 Iron deficiency anemia secondary to blood loss (chronic): Secondary | ICD-10-CM | POA: Diagnosis not present

## 2018-05-27 DIAGNOSIS — R700 Elevated erythrocyte sedimentation rate: Secondary | ICD-10-CM | POA: Diagnosis not present

## 2018-05-27 DIAGNOSIS — R21 Rash and other nonspecific skin eruption: Secondary | ICD-10-CM | POA: Diagnosis not present

## 2018-05-27 DIAGNOSIS — M2550 Pain in unspecified joint: Secondary | ICD-10-CM

## 2018-05-27 DIAGNOSIS — Z8709 Personal history of other diseases of the respiratory system: Secondary | ICD-10-CM | POA: Diagnosis not present

## 2018-05-27 DIAGNOSIS — M170 Bilateral primary osteoarthritis of knee: Secondary | ICD-10-CM

## 2018-05-27 DIAGNOSIS — L509 Urticaria, unspecified: Secondary | ICD-10-CM | POA: Diagnosis not present

## 2018-05-28 ENCOUNTER — Ambulatory Visit: Payer: No Typology Code available for payment source | Admitting: Allergy and Immunology

## 2018-05-28 ENCOUNTER — Telehealth: Payer: Self-pay | Admitting: Hematology & Oncology

## 2018-05-28 NOTE — Telephone Encounter (Signed)
Faxed medical records to: [facility] P: [phone] P: [phone] F: [phone] F: [phone] Alt       COPY SCANNED

## 2018-07-03 ENCOUNTER — Other Ambulatory Visit: Payer: Self-pay | Admitting: Family

## 2018-07-03 DIAGNOSIS — D500 Iron deficiency anemia secondary to blood loss (chronic): Secondary | ICD-10-CM

## 2018-07-04 ENCOUNTER — Inpatient Hospital Stay: Payer: No Typology Code available for payment source

## 2018-07-04 ENCOUNTER — Inpatient Hospital Stay: Payer: No Typology Code available for payment source | Admitting: Family

## 2018-10-21 ENCOUNTER — Telehealth: Payer: Self-pay | Admitting: *Deleted

## 2018-10-21 DIAGNOSIS — M1990 Unspecified osteoarthritis, unspecified site: Secondary | ICD-10-CM

## 2018-10-21 NOTE — Telephone Encounter (Signed)
Copied from CRM [id number]. Topic: Referral - Request for Referral >> Oct 21, 2018  3:43 PM Percival Spanish wrote:     Pt said she has new insurance [employer]   Has patient seen PCP for this complaint yes  PCP sent her to Dr Corliss Skains who in turn has referred her to [facility]   If NO  is insurance requiring patient see PCP for this issue before PCP can refer them?  Referral for which specialty  Rheumologist    Preferred provider office   [facility] at [facility]   Reason for referral Joint pain ,inflamation

## 2018-10-24 ENCOUNTER — Telehealth: Payer: Self-pay | Admitting: Rheumatology

## 2018-10-24 NOTE — Telephone Encounter (Signed)
[place] from [facility] left a voicemail stating Amore is a mutual patient and are calling to find out if she has been on any previous medications for her Rhumentology.  Phone #[phone]

## 2018-10-24 NOTE — Telephone Encounter (Signed)
Returned Brittany's call and advised her of information below.

## 2018-10-24 NOTE — Telephone Encounter (Signed)
We have seen patient 2 times and we did not start her on any medications. She did have a prednisone taper given by PCP.

## 2018-11-18 ENCOUNTER — Ambulatory Visit: Payer: No Typology Code available for payment source | Admitting: Family

## 2018-11-18 ENCOUNTER — Encounter: Payer: Self-pay | Admitting: Family

## 2018-11-18 DIAGNOSIS — Z0289 Encounter for other administrative examinations: Secondary | ICD-10-CM

## 2018-11-24 ENCOUNTER — Ambulatory Visit (INDEPENDENT_AMBULATORY_CARE_PROVIDER_SITE_OTHER): Payer: No Typology Code available for payment source | Admitting: Family

## 2018-11-24 ENCOUNTER — Encounter: Payer: Self-pay | Admitting: Family

## 2018-11-24 VITALS — BP 94/60 | HR 94 | Temp 98.5°F | Resp 16 | Ht <= 58 in | Wt 184.0 lb

## 2018-11-24 DIAGNOSIS — M1990 Unspecified osteoarthritis, unspecified site: Secondary | ICD-10-CM | POA: Diagnosis not present

## 2018-11-24 DIAGNOSIS — J4520 Mild intermittent asthma, uncomplicated: Secondary | ICD-10-CM

## 2018-11-24 DIAGNOSIS — J209 Acute bronchitis, unspecified: Secondary | ICD-10-CM

## 2018-11-24 DIAGNOSIS — E611 Iron deficiency: Secondary | ICD-10-CM | POA: Diagnosis not present

## 2018-11-24 MED ORDER — PREDNISONE 10 MG PO TABS: ORAL_TABLET | ORAL | 0 refills | Status: DC

## 2018-11-24 MED ORDER — ALBUTEROL SULFATE HFA 108 (90 BASE) MCG/ACT IN AERS: 2.0000 | INHALATION_SPRAY | RESPIRATORY_TRACT | 4 refills | Status: DC | PRN

## 2018-11-24 MED ORDER — AZITHROMYCIN 250 MG PO TABS: ORAL_TABLET | ORAL | 0 refills | Status: DC

## 2018-11-24 NOTE — Patient Instructions (Signed)
Please begin prednisone taper for asthma. You may use albuterol every 6 hours as needed. Begin azithromycin for bronchitis.  Call if symptoms worsen or if symptoms fail to improve in 3-4 days.

## 2018-11-24 NOTE — Progress Notes (Signed)
Subjective:     Patient ID: [name], female   DOB: Jun 28, 1986, 32 y.o.   MRN: [id number]  HPI   Reports dcrased ROM of the right wrist for 1 month. She reports that she saw rheumatology and was sent to [facility].    Notes cough for a few weeks.  Has had some sneezing and nasal drainage sweating.   Iron deficiency anemia- saw hematology and was given iron infusion, requests referral back to them since she has a new insurance.  Review of Systems    see HPI  Past Medical History:  Diagnosis Date  . Asthma   . Iron deficiency anemia due to chronic blood loss 05/15/2018  . Urticaria      Social History   Socioeconomic History  . Marital status: Significant Other    Spouse name: Not on file  . Number of children: Not on file  . Years of education: Not on file  . Highest education level: Not on file  Occupational History  . Occupation: Academic librarian: [employer]  Social Needs  . Financial resource strain: Not hard at all  . Food insecurity:    Worry: Never true    Inability: Never true  . Transportation needs:    Medical: No    Non-medical: No  Tobacco Use  . Smoking status: Never Smoker  . Smokeless tobacco: Never Used  Substance and Sexual Activity  . Alcohol use: Yes    Comment: socially 2 per week  . Drug use: No  . Sexual activity: Yes    Partners: Male    Birth control/protection: I.U.D.  Lifestyle  . Physical activity:    Days per week: 0 days    Minutes per session: Not on file  . Stress: Only a little  Relationships  . Social connections:    Talks on phone: More than three times a week    Gets together: Never    Attends religious service: Never    Active member of club or organization: Not on file    Attends meetings of clubs or organizations: Never    Relationship status: Not on file  . Intimate partner violence:    Fear of current or ex partner: Not on file    Emotionally abused: Not on file    Physically abused: Not on file    Forced sexual  activity: Not on file  Other Topics Concern  . 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      Past Surgical History:  Procedure Laterality Date  . CESAREAN SECTION  2009  . CESAREAN SECTION  08/21/2012   Procedure: CESAREAN SECTION;  Surgeon: Loney Laurence, MD;  Location: [facility];  Service: Obstetrics;  Laterality: N/A;    Family History  Problem Relation Age of Onset  . Fibromyalgia Mother   . Deep vein thrombosis Mother        recurrent dvt  . Eczema Maternal Uncle   . Migraines Sister   . Healthy Son   . Healthy Daughter   . Other Neg Hx     No Known Allergies  Current Outpatient Medications on File Prior to Visit  Medication Sig Dispense Refill  . levonorgestrel (MIRENA, 52 MG,) 20 MCG/24HR IUD 1 Intra Uterine Device (1  each total) by Intrauterine route once for 1 dose. 1 each 0   No current facility-administered medications on file prior to visit.     BP 94/60 (BP Location: Left Arm, Patient Position: Sitting, Cuff Size: Large)   Pulse 94   Temp 98.5 F (36.9 C) (Oral)   Resp 16   Ht 4\' 10"  (1.473 m)   Wt 184 lb (83.5 kg)   SpO2 99%   BMI 38.46 kg/m    Objective:   Physical Exam Constitutional:      Appearance: She is well-developed.  HENT:     Right Ear: Tympanic membrane and ear canal normal.     Left Ear: Tympanic membrane and ear canal normal.  Neck:     Musculoskeletal: Neck supple.     Thyroid: No thyromegaly.  Cardiovascular:     Rate and Rhythm: Normal rate and regular rhythm.     Heart sounds: Normal heart sounds. No murmur.  Pulmonary:     Effort: No respiratory distress.     Breath sounds: Wheezing present.     Comments: Coarse cough Musculoskeletal:     Comments: + swelling and tenderness of right wrist. Decreased flexion/extension of the right wrist.  Skin:     General: Skin is warm and dry.  Neurological:     Mental Status: She is alert and oriented to person, place, and time.  Psychiatric:        Behavior: Behavior normal.        Thought Content: Thought content normal.        Judgment: Judgment normal.        Assessment:     Bronchitis with bronchospasm- rx with pred taper, albuterol and azithromycin. Pt is advised to call if symptoms worsen or if symptoms fail to improve.  Inflammatory arthritis- rx with prednisone- advised pt to follow up with local rheumatologist and rheumatologist at [facility].  Iron deficiency anemia- refer back to hematology    Plan:

## 2018-11-28 ENCOUNTER — Telehealth: Payer: Self-pay | Admitting: Family

## 2018-11-28 NOTE — Telephone Encounter (Signed)
See phone note

## 2018-12-09 ENCOUNTER — Encounter: Payer: Self-pay | Admitting: Family

## 2018-12-09 ENCOUNTER — Inpatient Hospital Stay: Payer: 59

## 2018-12-09 ENCOUNTER — Other Ambulatory Visit: Payer: Self-pay

## 2018-12-09 ENCOUNTER — Inpatient Hospital Stay: Payer: 59 | Attending: Family | Admitting: Family

## 2018-12-09 ENCOUNTER — Telehealth: Payer: Self-pay | Admitting: Family

## 2018-12-09 VITALS — BP 127/65 | HR 93 | Temp 98.5°F | Resp 16 | Wt 188.0 lb

## 2018-12-09 DIAGNOSIS — D509 Iron deficiency anemia, unspecified: Secondary | ICD-10-CM | POA: Diagnosis not present

## 2018-12-09 DIAGNOSIS — R7989 Other specified abnormal findings of blood chemistry: Secondary | ICD-10-CM

## 2018-12-09 DIAGNOSIS — Z79899 Other long term (current) drug therapy: Secondary | ICD-10-CM | POA: Diagnosis not present

## 2018-12-09 DIAGNOSIS — D500 Iron deficiency anemia secondary to blood loss (chronic): Secondary | ICD-10-CM

## 2018-12-09 LAB — CBC WITH DIFFERENTIAL (CANCER CENTER ONLY)
Abs Immature Granulocytes: 0.04 10*3/uL (ref 0.00–0.07)
BASOS ABS: 0 10*3/uL (ref 0.0–0.1)
Basophils Relative: 0 %
EOS PCT: 2 %
Eosinophils Absolute: 0.3 10*3/uL (ref 0.0–0.5)
HEMATOCRIT: 43.5 % (ref 36.0–46.0)
HEMOGLOBIN: 13.9 g/dL (ref 12.0–15.0)
IMMATURE GRANULOCYTES: 0 %
LYMPHS ABS: 1.7 10*3/uL (ref 0.7–4.0)
LYMPHS PCT: 15 %
MCH: 26.2 pg (ref 26.0–34.0)
MCHC: 32 g/dL (ref 30.0–36.0)
MCV: 81.9 fL (ref 80.0–100.0)
MONOS PCT: 4 %
Monocytes Absolute: 0.5 10*3/uL (ref 0.1–1.0)
NRBC: 0 % (ref 0.0–0.2)
Neutro Abs: 8.7 10*3/uL — ABNORMAL HIGH (ref 1.7–7.7)
Neutrophils Relative %: 79 %
Platelet Count: 394 10*3/uL (ref 150–400)
RBC: 5.31 MIL/uL — ABNORMAL HIGH (ref 3.87–5.11)
RDW: 15.4 % (ref 11.5–15.5)
WBC Count: 11.2 10*3/uL — ABNORMAL HIGH (ref 4.0–10.5)

## 2018-12-09 LAB — IRON AND TIBC
IRON: 60 ug/dL (ref 41–142)
SATURATION RATIOS: 20 % — AB (ref 21–57)
TIBC: 306 ug/dL (ref 236–444)
UIBC: 246 ug/dL (ref 120–384)

## 2018-12-09 LAB — RETICULOCYTES
IMMATURE RETIC FRACT: 9.7 % (ref 2.3–15.9)
RBC.: 5.31 MIL/uL — ABNORMAL HIGH (ref 3.87–5.11)
Retic Count, Absolute: 87.6 10*3/uL (ref 19.0–186.0)
Retic Ct Pct: 1.7 % (ref 0.4–3.1)

## 2018-12-09 LAB — FERRITIN: Ferritin: 186 ng/mL (ref 11–307)

## 2018-12-09 NOTE — Telephone Encounter (Signed)
Appointments scheduled letter/calendar mailed per 12/31 los

## 2018-12-09 NOTE — Progress Notes (Signed)
Hematology and Oncology Follow Up Visit  [name] [id number] Aug 11, 1986 32 y.o. 12/09/2018   Principle Diagnosis:  Iron deficiency anemia   Current Therapy:   IV iron as indicated    Interim History:  [name] is here today for follow-up. She is currently on a predicone taper for bronchitis.  She is followed by rheumatology for inflammatory arthritis. She still has occasional pain, numbness and tingling in her hands.  No swelling in her extremities at this time.  She had hr IUD replaced with the Mirena in October. She has had no bleeding.  No bruising or petechiae.  She has changed jobs and now works from home. She denies fatigue.  No fever, chills, cough, rash, dizziness, SOB, chest pain, palpitations, or changes in bladder habits.  She has been having issues with abdominal cramping and diarrhea off and on. She has also had a few episodes of n/v. She is not sure if this is due to a medication. She states that she will follow-up with her PCP if this persists.  Her appetite comes and goes. She is staying well hydrated. Her weight is stable.  No lymphadenopathy noted on exam.   ECOG Performance Status: 1 - Symptomatic but completely ambulatory  Medications:  Allergies as of 12/09/2018   No Known Allergies     Medication List       Accurate as of December 09, 2018 11:54 AM. Always use your most recent med list.        albuterol 108 (90 Base) MCG/ACT inhaler Commonly known as:  PROVENTIL HFA;VENTOLIN HFA Inhale 2 puffs into the lungs every 4 (four) hours as needed for wheezing or shortness of breath. Asthma attacks, emergency use only.   azithromycin 250 MG tablet Commonly known as:  ZITHROMAX 2 tabs by mouth today, then one tab once daily for 4 more days.   levonorgestrel 20 MCG/24HR IUD Commonly known as:  MIRENA (52 MG) 1 Intra Uterine Device (1 each total) by Intrauterine route once for 1 dose.   predniSONE 10 MG tablet Commonly known as:  DELTASONE 4 tabs  by mouth once daily for 2 days, then 3 tabs daily x 2 days, then 2 tabs daily x 2 days, then 1 tab daily x 2 days       Allergies: No Known Allergies  Past Medical History, Surgical history, Social history, and Family History were reviewed and updated.  Review of Systems: All other 10 point review of systems is negative.   Physical Exam:  vitals were not taken for this visit.   Wt Readings from Last 3 Encounters:  11/24/18 184 lb (83.5 kg)  05/27/18 187 lb (84.8 kg)  05/14/18 184 lb 6.4 oz (83.6 kg)    Ocular: Sclerae unicteric, pupils equal, round and reactive to light Ear-nose-throat: Oropharynx clear, dentition fair Lymphatic: No cervical, supraclavicular or axillary adenopathy Lungs no rales or rhonchi, good excursion bilaterally Heart regular rate and rhythm, no murmur appreciated Abd soft, nontender, positive bowel sounds, no liver or spleen tip palpated on exam, no fluid wave  MSK no focal spinal tenderness, no joint edema Neuro: non-focal, well-oriented, appropriate affect Breasts: Deferred   Lab Results  Component Value Date   WBC 9.3 05/14/2018   HGB 11.4 (L) 05/14/2018   HCT 35.2 05/14/2018   MCV 76.9 (L) 05/14/2018   PLT 494 (H) 05/14/2018   Lab Results  Component Value Date   FERRITIN 488 (H) 05/14/2018   IRON 34 (L) 05/14/2018   TIBC 229 (  L) 05/14/2018   UIBC 195 05/14/2018   IRONPCTSAT 15 (L) 05/14/2018   Lab Results  Component Value Date   RETICCTPCT 1.6 04/16/2018   RBC 4.58 05/14/2018   RETICCTABS 70,400 04/16/2018   No results found for: [facility] Lab Results  Component Value Date   IGGSERUM 1,220 04/02/2018   IGMSERUM 82 04/02/2018   Lab Results  Component Value Date   ALBUMINELP 3.1 (L) 04/02/2018   A1GS 0.5 (H) 04/02/2018   A2GS 1.5 (H) 04/02/2018   BETS 0.4 04/02/2018   BETA2SER 0.5 04/02/2018   GAMS 1.1 04/02/2018   SPEI  04/02/2018     Comment:     . Evaluation is consistent with an acute  inflammatory  pattern. . . A faint abnormal protein band is detected in the gamma globulins that most likely represents CRP, or circulating immune complexes, rather than a monoclonal immunoglobulin. . . Immunofixation analysis is available if identification of the band(s) is clinically indicated. .      Chemistry      Component Value Date/Time   NA 138 05/14/2018 1440   K 4.3 05/14/2018 1440   CL 106 05/14/2018 1440   CO2 25 05/14/2018 1440   BUN 7 05/14/2018 1440   CREATININE 0.50 (L) 05/14/2018 1440   CREATININE 0.59 04/02/2018 0908      Component Value Date/Time   CALCIUM 9.3 05/14/2018 1440   ALKPHOS 62 05/14/2018 1440   AST 20 05/14/2018 1440   ALT 20 05/14/2018 1440   BILITOT 0.8 05/14/2018 1440       Impression and Plan: [name] is a very pleasant 32 yo African American female with iron deficiency anemia as well as mildly elevated ferritin that is likely reactive due to arthritis.  We will see what her iron studies show and bring her back in for infusion if needed.  We will go ahead and plan to see her back in 4 months.  Shew ill contact our office with any questions or concerns. We can certainly see her sooner if need be.   Emeline Gins, NP 12/31/201911:54 AM

## 2018-12-11 ENCOUNTER — Telehealth: Payer: Self-pay | Admitting: Family

## 2018-12-11 NOTE — Telephone Encounter (Signed)
LMVM for patient regarding appointment for 1/9 per 12/31 sch msg

## 2018-12-17 ENCOUNTER — Inpatient Hospital Stay: Payer: 59 | Attending: Family

## 2018-12-17 VITALS — BP 120/55 | HR 69 | Temp 98.5°F | Resp 17

## 2018-12-17 DIAGNOSIS — D509 Iron deficiency anemia, unspecified: Secondary | ICD-10-CM | POA: Insufficient documentation

## 2018-12-17 DIAGNOSIS — D500 Iron deficiency anemia secondary to blood loss (chronic): Secondary | ICD-10-CM

## 2018-12-17 MED ORDER — SODIUM CHLORIDE 0.9 % IV SOLN
Freq: Once | INTRAVENOUS | Status: AC
  Administered 2018-12-17: 09:00:00 via INTRAVENOUS
  Filled 2018-12-17: qty 250

## 2018-12-17 MED ORDER — SODIUM CHLORIDE 0.9 % IV SOLN
510.0000 mg | Freq: Once | INTRAVENOUS | Status: AC
  Administered 2018-12-17: 510 mg via INTRAVENOUS
  Filled 2018-12-17: qty 17

## 2018-12-17 NOTE — Patient Instructions (Signed)

## 2018-12-18 ENCOUNTER — Ambulatory Visit: Payer: 59

## 2019-02-20 ENCOUNTER — Ambulatory Visit: Payer: 59 | Admitting: Family

## 2019-04-08 ENCOUNTER — Other Ambulatory Visit: Payer: 59

## 2019-04-08 ENCOUNTER — Ambulatory Visit: Payer: 59 | Admitting: Family

## 2020-05-02 ENCOUNTER — Other Ambulatory Visit: Payer: Self-pay | Admitting: Family

## 2021-09-18 ENCOUNTER — Ambulatory Visit: Payer: 59 | Admitting: Family

## 2021-09-19 ENCOUNTER — Telehealth: Payer: Self-pay | Admitting: Family

## 2021-09-19 ENCOUNTER — Other Ambulatory Visit: Payer: Self-pay

## 2021-09-19 ENCOUNTER — Ambulatory Visit (INDEPENDENT_AMBULATORY_CARE_PROVIDER_SITE_OTHER): Payer: 59 | Admitting: Family

## 2021-09-19 VITALS — BP 104/89 | HR 101 | Temp 98.4°F | Resp 16 | Ht <= 58 in | Wt 204.0 lb

## 2021-09-19 DIAGNOSIS — R202 Paresthesia of skin: Secondary | ICD-10-CM | POA: Diagnosis not present

## 2021-09-19 MED ORDER — MELOXICAM 7.5 MG PO TABS: 7.5000 mg | ORAL_TABLET | Freq: Every day | ORAL | 0 refills | Status: DC

## 2021-09-19 NOTE — Telephone Encounter (Signed)
See mychart.  

## 2021-09-19 NOTE — Progress Notes (Signed)
Subjective:   By signing my name below, I, Lyric Barr-McArthur, attest that this documentation has been prepared under the direction and in the presence of Sandford Craze, NP, 09/19/2021   Patient ID: [name], female    DOB: 1986/06/17, 35 y.o.   MRN: [id number]  Chief Complaint  Patient presents with   Hand Pain    Complains of left hand pain and numbness. "Runs up the arm"    HPI Patient is in today for an office visit. Left hand: She is experiencing numbness and tingling in her left hand in all of her fingers. She reports that she is unable to close her hand in a fist. She has experienced this sensation in her hand before but denies that any prior episodes have lasted as long as this one has which has been about a week and a half. She has never had this happen in her right hand. She notes that the last time she began feeling this she was sent to a rheumatologist who never diagnosed her but put her on flutamide. When she was on flutamide she notes the tingling and numbness got worse she stopped taking it. She denies any neck pain.  Left leg: She notes that the same sensation she gets in her left hand she also sometimes gets in her left leg. She also reports that it often happens at the same time as her hand tingling and numbness occurs. She denies any back pain. Immunizations: She is not interested in receiving her flu shot today. Health Maintenance Due  Topic Date Due   COVID-19 Vaccine (1) Never done   PAP SMEAR-Modifier  08/09/2013    Past Medical History:  Diagnosis Date   Asthma    Iron deficiency anemia due to chronic blood loss 05/15/2018   Urticaria     Past Surgical History:  Procedure Laterality Date   CESAREAN SECTION  2009   CESAREAN SECTION  08/21/2012   Procedure: CESAREAN SECTION;  Surgeon: Loney Laurence, MD;  Location: [facility];  Service: Obstetrics;  Laterality: N/A;    Family History  Problem Relation Age of Onset   Fibromyalgia Mother    Deep  vein thrombosis Mother        recurrent dvt   Eczema Maternal Uncle    Migraines Sister    Healthy Son    Healthy Daughter    Other Neg Hx     Social History   Socioeconomic History   Marital status: Significant Other    Spouse name: Not on file   Number of children: Not on file   Years of education: Not on file   Highest education level: Not on file  Occupational History   Occupation: nurse    Employer: [employer]  Tobacco Use   Smoking status: Never   Smokeless tobacco: Never  Vaping Use   Vaping Use: Never used  Substance and Sexual Activity   Alcohol use: Yes    Comment: socially 2 per week   Drug use: No   Sexual activity: Yes    Partners: Male    Birth control/protection: I.U.D.  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     Social Determinants of Health   Financial Resource Strain: Not on  file  Food Insecurity: Not on file  Transportation Needs: Not on file  Physical Activity: Not on file  Stress: Not on file  Social Connections: Not on file  Intimate Partner Violence: Not on file    Outpatient Medications Prior to Visit  Medication Sig Dispense Refill   levonorgestrel (MIRENA, 52 MG,) 20 MCG/24HR IUD 1 Intra Uterine Device (1 each total) by Intrauterine route once for 1 dose. 1 each 0   albuterol (PROVENTIL HFA;VENTOLIN HFA) 108 (90 Base) MCG/ACT inhaler Inhale 2 puffs into the lungs every 4 (four) hours as needed for wheezing or shortness of breath. Asthma attacks, emergency use only. 1 Inhaler 4   predniSONE (DELTASONE) 10 MG tablet 4 tabs by mouth once daily for 2 days, then 3 tabs daily x 2 days, then 2 tabs daily x 2 days, then 1 tab daily x 2 days 20 tablet 0   No facility-administered medications prior to visit.    No Known Allergies  Review of Systems   Neurological:  Positive for tingling (in hand and fingers).       (+) decreased mobility in left hand fingers (+) loss of grip strength in left hand (+) numbness in left hand      Objective:    Physical Exam Constitutional:      General: She is not in acute distress.    Appearance: Normal appearance. She is not ill-appearing.  HENT:     Head: Normocephalic and atraumatic.     Right Ear: External ear normal.     Left Ear: External ear normal.  Eyes:     Extraocular Movements: Extraocular movements intact.     Pupils: Pupils are equal, round, and reactive to light.  Cardiovascular:     Rate and Rhythm: Normal rate and regular rhythm.     Heart sounds: Normal heart sounds. No murmur heard.   No gallop.  Pulmonary:     Effort: Pulmonary effort is normal. No respiratory distress.     Breath sounds: Normal breath sounds. No wheezing or rales.  Lymphadenopathy:     Cervical: No cervical adenopathy.  Skin:    General: Skin is warm and dry.  Neurological:     Mental Status: She is alert and oriented to person, place, and time.     Comments: (-) Phalen's maneuver  (+) mildly positive tinel's test in left pinky finger (+) decreased sensation of the 2nd, 3rd, 4th, and meical 5th finger.  Psychiatric:        Behavior: Behavior normal.        Judgment: Judgment normal.    BP 104/89 (BP Location: Right Arm, Patient Position: Sitting, Cuff Size: Large)   Pulse (!) 101   Temp 98.4 F (36.9 C) (Oral)   Resp 16   Ht 4' 9.5" (1.461 m)   Wt 204 lb (92.5 kg)   SpO2 100%   BMI 43.38 kg/m  Wt Readings from Last 3 Encounters:  09/19/21 204 lb (92.5 kg)  12/09/18 188 lb (85.3 kg)  11/24/18 184 lb (83.5 kg)       Assessment & Plan:   Problem List Items Addressed This Visit       Unprioritized   Paresthesia - Primary    New.  ? Left Carpal tunnel syndrome. Recommended that she purchase on otc left wrist splint and wear while sleeping and as able during the day. Will check b12  and folate. Due to her c/o intermittent LLE numbness it also makes me concerned about the  possibility of MS in this young woman who has autoimmune disease. Will refer to neurology for further evaluation.       Relevant Orders   Ambulatory referral to Neurology   B12   Folate   Meds ordered this encounter  Medications   meloxicam (MOBIC) 7.5 MG tablet    Sig: Take 1 tablet (7.5 mg total) by mouth daily.    Dispense:  14 tablet    Refill:  0    Order Specific Question:   Supervising Provider    Answer:   Danise Edge A [[id number]]    I, Sandford Craze, NP, personally preformed the services described in this documentation.  All medical record entries made by the scribe were at my direction and in my presence.  I have reviewed the chart and discharge instructions (if applicable) and agree that the record reflects my personal performance and is accurate and complete. 09/19/2021  I,Lyric Barr-McArthur,acting as a Neurosurgeon for Lemont Fillers, NP.,have documented all relevant documentation on the behalf of Lemont Fillers, NP,as directed by  Lemont Fillers, NP while in the presence of Lemont Fillers, NP.  Lemont Fillers, NP

## 2021-09-19 NOTE — Patient Instructions (Signed)
Please complete lab work prior to leaving. Start meloxicam (anti-inflammatory) once daily. Schedule a follow up with your rheumatologist. You should be contacted about scheduling your appointment with the neurologist for further evaluation.

## 2021-09-19 NOTE — Assessment & Plan Note (Signed)
New.  ? Left Carpal tunnel syndrome. Recommended that she purchase on otc left wrist splint and wear while sleeping and as able during the day. Will check b12 and folate. Due to her c/o intermittent LLE numbness it also makes me concerned about the possibility of MS in this young woman who has autoimmune disease. Will refer to neurology for further evaluation.

## 2021-09-20 LAB — VITAMIN B12: Vitamin B-12: 1133 pg/mL — ABNORMAL HIGH (ref 211–911)

## 2021-09-20 LAB — FOLATE: Folate: 10.9 ng/mL (ref >5.9)

## 2021-09-21 ENCOUNTER — Encounter: Payer: Self-pay | Admitting: Neurology

## 2021-10-02 ENCOUNTER — Encounter: Payer: Self-pay | Admitting: Family

## 2021-10-02 MED ORDER — GABAPENTIN 100 MG PO CAPS: 100.0000 mg | ORAL_CAPSULE | Freq: Three times a day (TID) | ORAL | 3 refills | Status: DC

## 2021-10-05 ENCOUNTER — Other Ambulatory Visit: Payer: Self-pay | Admitting: Family

## 2021-10-22 ENCOUNTER — Other Ambulatory Visit: Payer: Self-pay | Admitting: Family

## 2021-10-30 ENCOUNTER — Ambulatory Visit: Payer: 59 | Admitting: Neurology

## 2021-11-03 ENCOUNTER — Other Ambulatory Visit: Payer: Self-pay | Admitting: Family

## 2021-11-14 ENCOUNTER — Other Ambulatory Visit: Payer: Self-pay | Admitting: Family

## 2022-01-23 ENCOUNTER — Encounter: Payer: Self-pay | Admitting: Family

## 2022-01-23 ENCOUNTER — Ambulatory Visit (INDEPENDENT_AMBULATORY_CARE_PROVIDER_SITE_OTHER): Payer: Medicaid Other | Admitting: Family

## 2022-01-23 ENCOUNTER — Other Ambulatory Visit (HOSPITAL_COMMUNITY)
Admission: RE | Admit: 2022-01-23 | Discharge: 2022-01-23 | Disposition: A | Discharge status: Home / Self Care | Payer: Medicaid Other | Source: Ambulatory Visit | Attending: Family | Admitting: Family

## 2022-01-23 ENCOUNTER — Telehealth: Payer: Self-pay | Admitting: Family

## 2022-01-23 VITALS — BP 134/81 | HR 99 | Temp 98.2°F | Resp 16 | Ht <= 58 in | Wt 216.0 lb

## 2022-01-23 DIAGNOSIS — N611 Abscess of the breast and nipple: Secondary | ICD-10-CM | POA: Diagnosis not present

## 2022-01-23 DIAGNOSIS — Z0000 Encounter for general adult medical examination without abnormal findings: Secondary | ICD-10-CM

## 2022-01-23 DIAGNOSIS — M059 Rheumatoid arthritis with rheumatoid factor, unspecified: Secondary | ICD-10-CM

## 2022-01-23 DIAGNOSIS — Z01419 Encounter for gynecological examination (general) (routine) without abnormal findings: Secondary | ICD-10-CM | POA: Diagnosis not present

## 2022-01-23 DIAGNOSIS — M069 Rheumatoid arthritis, unspecified: Secondary | ICD-10-CM | POA: Insufficient documentation

## 2022-01-23 DIAGNOSIS — R430 Anosmia: Secondary | ICD-10-CM

## 2022-01-23 DIAGNOSIS — R635 Abnormal weight gain: Secondary | ICD-10-CM

## 2022-01-23 MED ORDER — CEPHALEXIN 500 MG PO CAPS: 500.0000 mg | ORAL_CAPSULE | Freq: Three times a day (TID) | ORAL | 0 refills | Status: DC

## 2022-01-23 NOTE — Assessment & Plan Note (Signed)
She has seen rheumatology in the past but did not wish to be on any medication. She prefers to focus on exercise and low inflammatory diet.

## 2022-01-23 NOTE — Assessment & Plan Note (Signed)
Uncontrolled. Will refer to nutrition. We also spoke at length about food and drink choices/exercise.

## 2022-01-23 NOTE — Patient Instructions (Addendum)
Stop all soda and fried food. Eat 3 meals a day. Try to have a lean protein (chicken fish, Malaw[place]urkey, eggs, low yogurt/cottage cheese) and 2 non-starchy vegetables.  1-2 small pieces of fruit   Please schedule a routine eye exam.

## 2022-01-23 NOTE — Telephone Encounter (Signed)
Patient advised of antibiotics and scheduled to come back in one week.

## 2022-01-23 NOTE — Assessment & Plan Note (Signed)
New. Rx with Keflex, follow up in 1 week. If no improvement will need further breast imaging.

## 2022-01-23 NOTE — Progress Notes (Signed)
Subjective:     Patient ID: [name], female    DOB: 10/02/1986, 36 y.o.   MRN: [id number]  Chief Complaint  Patient presents with   Annual Exam    Annual Exam     HPI  Patient presents today for complete physical.  Immunizations: 2017 Diet: Reports that she is struggling with her weight. She has tried phentermine but gained all the weight back.  Just coming off of nights (6 yrs)  Breakfast: coffee, mini bagel Lunch: Cheeseburger (ketchup/pickles/onions) homemade chips Soda  Dinner:  Cheesesteaks, potato wedges, soda.  Wt Readings from Last 3 Encounters:  01/23/22 216 lb (98 kg)  09/19/21 204 lb (92.5 kg)  12/09/18 188 lb (85.3 kg)  Exercise: yes Pap Smear: due, can't remember when she had her last pap Vision: due Dental: up to date  RA- did not want to go on medications.  Not following with rheumatology.  She reports difficulty breathing out of her right nare and inability to smell since she had covid.   Health Maintenance Due  Topic Date Due   PAP SMEAR-Modifier  08/09/2013    Past Medical History:  Diagnosis Date   Asthma    Iron deficiency anemia due to chronic blood loss 05/15/2018   Urticaria    Urticaria 04/21/2018   History of recurrent urticaria    Past Surgical History:  Procedure Laterality Date   CESAREAN SECTION  2009   CESAREAN SECTION  08/21/2012   Procedure: CESAREAN SECTION;  Surgeon: Daria Pastures, MD;  Location: [facility];  Service: Obstetrics;  Laterality: N/A;    Family History  Problem Relation Age of Onset   Fibromyalgia Mother    Deep vein thrombosis Mother        recurrent dvt   Eczema Maternal Uncle    Migraines Sister    Healthy Son    Healthy Daughter    Other Neg Hx     Social History   Socioeconomic History   Marital status: Significant Other    Spouse name: Not on file   Number of children: Not on file   Years of education: Not on file   Highest education level: Not on file  Occupational History    Occupation: nurse    Employer: [employer]  Tobacco Use   Smoking status: Never   Smokeless tobacco: Never  Vaping Use   Vaping Use: Never used  Substance and Sexual Activity   Alcohol use: Yes    Comment: socially 2 per week   Drug use: No   Sexual activity: Yes    Partners: Male    Birth control/protection: I.U.D.  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     Social Determinants of Health   Financial Resource Strain: Not on file  Food Insecurity: Not on file  Transportation Needs: Not on file  Physical Activity: Not on file  Stress: Not on file  Social Connections: Not on file  Intimate Partner Violence: Not on file    Outpatient Medications Prior to Visit  Medication Sig Dispense Refill   gabapentin (NEURONTIN) 100 MG capsule Take 1 capsule (100 mg total) by mouth 3 (three) times daily. 90 capsule 3   meloxicam (MOBIC) 7.5 MG tablet TAKE  1 TABLET(7.5 MG) BY MOUTH DAILY 14 tablet 0   levonorgestrel (MIRENA, 52 MG,) 20 MCG/24HR IUD 1 Intra Uterine Device (1 each total) by Intrauterine route once for 1 dose. 1 each 0   No facility-administered medications prior to visit.    No Known Allergies  Review of Systems  Constitutional:  Negative for fever and weight loss.  HENT:  Negative for congestion and hearing loss.   Eyes:  Negative for blurred vision.  Respiratory:  Positive for cough (mild from uri last week which is improving).   Cardiovascular:  Negative for leg swelling.  Gastrointestinal:  Negative for constipation and diarrhea.  Genitourinary:  Negative for dysuria and frequency.       Notes some pressure in the vaginal area.  Notes more after she empties her bladder  Musculoskeletal:  Negative for joint pain and myalgias.  Skin:  Negative for rash.   Neurological:  Negative for headaches.  Psychiatric/Behavioral:         Denies depression Notes some anxiety (situational)       Objective:    Physical Exam  BP 134/81 (BP Location: Right Arm, Patient Position: Sitting, Cuff Size: Normal)    Pulse 99    Temp 98.2 F (36.8 C) (Oral)    Resp 16    Ht _0  (1.448 m)    Wt 216 lb (98 kg)    SpO2 100%    BMI 46.74 kg/m  Wt Readings from Last 3 Encounters:  01/23/22 216 lb (98 kg)  09/19/21 204 lb (92.5 kg)  12/09/18 188 lb (85.3 kg)   Physical Exam  Constitutional: She is oriented to person, place, and time. She appears well-developed and well-nourished. No distress.  HENT:  Head: Normocephalic and atraumatic.  Nares: normal Right Ear: Tympanic membrane and ear canal normal.  Left Ear: Tympanic membrane and ear canal normal.  Mouth/Throat: Oropharynx is clear and moist.  Eyes: Pupils are equal, round, and reactive to light. No scleral icterus.  Neck: Normal range of motion. No thyromegaly present.  Cardiovascular: Normal rate and regular rhythm.   No murmur heard. Pulmonary/Chest: Effort normal and breath sounds normal. No respiratory distress. He has no wheezes. She has no rales. She exhibits no tenderness.  Abdominal: Soft. Bowel sounds are normal. She exhibits no distension and no mass. There is no tenderness. There is no rebound and no guarding.  Musculoskeletal: She exhibits no edema.  Lymphadenopathy:    She has no cervical adenopathy.  Neurological: She is alert and oriented to person, place, and time. She has normal patellar reflexes. She exhibits normal muscle tone. Coordination normal.  Skin: Skin is warm and dry.  Psychiatric: She has a normal mood and affect. Her behavior is normal. Judgment and thought content normal.  Breasts: Examined lying Right: Without masses, retractions, discharge or axillary adenopathy.  She does have a superficial area of fluctuance with surrounding erythema right breast at 5 oclock Left:  Without masses, retractions, discharge or axillary adenopathy.  Inguinal/mons: Normal without inguinal adenopathy  External genitalia: Normal  BUS/Urethra/Skene's glands: Normal  Bladder: Normal  Vagina: Normal  Cervix: Normal  Uterus: normal in size, shape and contour. Midline and mobile  Adnexa/parametria:  Rt: Without masses or tenderness.  Lt: Without masses or tenderness.  Anus and perineum: Normal            Assessment & Plan:        Assessment & Plan:   Problem List Items Addressed This Visit  Unprioritized   Rheumatoid arthritis ([place])    She has seen rheumatology in the past but did not wish to be on any medication. She prefers to focus on exercise and low inflammatory diet.       Preventative health care - Primary    Pap performed today. Immunizations reviewed and up to date. Recommended that she schedule an eye visit for routine exam.  Encouraged that she aim for 30 minutes 5 days a week of exercise.       Relevant Orders   Comp Met ([facility])   Lipid panel   TSH   Morbid obesity ([place])    Uncontrolled. Will refer to nutrition. We also spoke at length about food and drink choices/exercise.       Relevant Orders   Amb ref to Medical Nutrition Therapy-MNT   Comp Met ([facility]ET)   Lipid panel   TSH   Breast abscess    New. Rx with Keflex, follow up in 1 week. If no improvement will need further breast imaging.       Anosmia    New. Also trouble breathing out of the right nare, refer to ENT.      Relevant Orders   Ambulatory referral to ENT   Other Visit Diagnoses     Weight gain       Encounter for routine gynecological examination with Papanicolaou smear of cervix       Relevant Orders   Cytology - PAP( [place])       I am having Lynnetta A. Harper start on cephALEXin. I am also having her maintain her levonorgestrel, gabapentin, and meloxicam.  Meds ordered this encounter  Medications   cephALEXin (KEFLEX) 500 MG capsule    Sig: Take 1  capsule (500 mg total) by mouth 3 (three) times daily.    Dispense:  21 capsule    Refill:  0    Order Specific Question:   Supervising Provider    Answer:   Penni Homans A [[id number]]

## 2022-01-23 NOTE — Assessment & Plan Note (Signed)
Pap performed today. Immunizations reviewed and up to date. Recommended that she schedule an eye visit for routine exam.  Encouraged that she aim for 30 minutes 5 days a week of exercise.

## 2022-01-23 NOTE — Assessment & Plan Note (Signed)
New. Also trouble breathing out of the right nare, refer to ENT.

## 2022-01-23 NOTE — Telephone Encounter (Signed)
Please let pt know that I sent an rx for keflex for the breast boil.  Lets bring her back in 1 week for re-evaluation please.

## 2022-01-25 LAB — CYTOLOGY - PAP
Comment: NEGATIVE
Diagnosis: NEGATIVE
Diagnosis: REACTIVE
High risk HPV: NEGATIVE

## 2022-01-27 ENCOUNTER — Telehealth: Payer: Self-pay | Admitting: Family

## 2022-01-27 MED ORDER — METRONIDAZOLE 0.75 % VA GEL: 1.0000 | Freq: Every day | VAGINAL | 0 refills | Status: AC

## 2022-01-27 NOTE — Telephone Encounter (Signed)
Pap smear is negative but suggests that she likely has BV. I would like her to complete metrogel. Rx sent.

## 2022-01-29 NOTE — Telephone Encounter (Signed)
Left vm to return call.    

## 2022-01-30 NOTE — Telephone Encounter (Signed)
Lvm for patient to call back

## 2022-01-31 ENCOUNTER — Ambulatory Visit: Payer: Medicaid Other | Admitting: Family

## 2022-01-31 VITALS — BP 109/66 | HR 96 | Temp 98.7°F | Resp 16 | Wt 221.0 lb

## 2022-01-31 DIAGNOSIS — N611 Abscess of the breast and nipple: Secondary | ICD-10-CM | POA: Diagnosis not present

## 2022-01-31 NOTE — Telephone Encounter (Signed)
Will be here today for appointment

## 2022-01-31 NOTE — Progress Notes (Incomplete)
Subjective:   By signing my name below, I, [name], attest that this documentation has been prepared under the direction and in the presence of Janet Craze, NP 01/31/2022     Patient ID: [name], female    DOB: 1986-02-22, 36 y.o.   MRN: [id number]  No chief complaint on file.   HPI Patient is in today for a office visit.    There are no preventive care reminders to display for this patient.  Past Medical History:  Diagnosis Date   Asthma    Iron deficiency anemia due to chronic blood loss 05/15/2018   Urticaria    Urticaria 04/21/2018   History of recurrent urticaria    Past Surgical History:  Procedure Laterality Date   CESAREAN SECTION  2009   CESAREAN SECTION  08/21/2012   Procedure: CESAREAN SECTION;  Surgeon: Janet Laurence, MD;  Location: [facility];  Service: Obstetrics;  Laterality: N/A;    Family History  Problem Relation Age of Onset   Fibromyalgia Mother    Deep vein thrombosis Mother        recurrent dvt   Eczema Maternal Uncle    Migraines Sister    Healthy Son    Healthy Daughter    Other Neg Hx     Social History   Socioeconomic History   Marital status: Significant Other    Spouse name: Not on file   Number of children: Not on file   Years of education: Not on file   Highest education level: Not on file  Occupational History   Occupation: nurse    Employer: [employer]  Tobacco Use   Smoking status: Never   Smokeless tobacco: Never  Vaping Use   Vaping Use: Never used  Substance and Sexual Activity   Alcohol use: Yes    Comment: socially 2 per week   Drug use: No   Sexual activity: Yes    Partners: Male    Birth control/protection: I.U.D.  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     Social Determinants of  Health   Financial Resource Strain: Not on file  Food Insecurity: Not on file  Transportation Needs: Not on file  Physical Activity: Not on file  Stress: Not on file  Social Connections: Not on file  Intimate Partner Violence: Not on file    Outpatient Medications Prior to Visit  Medication Sig Dispense Refill   cephALEXin (KEFLEX) 500 MG capsule Take 1 capsule (500 mg total) by mouth 3 (three) times daily. 21 capsule 0   gabapentin (NEURONTIN) 100 MG capsule Take 1 capsule (100 mg total) by mouth 3 (three) times daily. 90 capsule 3   levonorgestrel (MIRENA, 52 MG,) 20 MCG/24HR IUD 1 Intra Uterine Device (1 each total) by Intrauterine route once for 1 dose. 1 each 0   meloxicam (MOBIC) 7.5 MG tablet TAKE 1 TABLET(7.5 MG) BY MOUTH DAILY 14 tablet 0   metroNIDAZOLE (METROGEL VAGINAL) 0.75 % vaginal gel Place 1 Applicatorful vaginally at bedtime for 5 days. 70 g 0   No facility-administered medications prior to visit.    No Known Allergies  ROS     Objective:    Physical Exam Constitutional:  General: She is not in acute distress.    Appearance: Normal appearance. She is not ill-appearing.  HENT:     Head: Normocephalic and atraumatic.     Right Ear: External ear normal.     Left Ear: External ear normal.  Eyes:     Extraocular Movements: Extraocular movements intact.     Pupils: Pupils are equal, round, and reactive to light.  Cardiovascular:     Rate and Rhythm: Normal rate and regular rhythm.     Heart sounds: Normal heart sounds. No murmur heard.   No gallop.  Pulmonary:     Effort: Pulmonary effort is normal. No respiratory distress.     Breath sounds: Normal breath sounds. No wheezing or rales.  Skin:    General: Skin is warm and dry.  Neurological:     Mental Status: She is alert and oriented to person, place, and time.  Psychiatric:        Behavior: Behavior normal.    There were no vitals taken for this visit. Wt Readings from Last 3 Encounters:   01/23/22 216 lb (98 kg)  09/19/21 204 lb (92.5 kg)  12/09/18 188 lb (85.3 kg)       Assessment & Plan:   Problem List Items Addressed This Visit   None    No orders of the defined types were placed in this encounter.   Desma McgregorI, O'Sullivan, Melissa, NP, personally preformed the services described in this documentation.  All medical record entries made by the scribe were at my direction and in my presence.  I have reviewed the chart and discharge instructions (if applicable) and agree that the record reflects my personal performance and is accurate and complete. 01/31/2022   I,[name],acting as a scribe for Lemont FillersMelissa S O'Sullivan, NP.,have documented all relevant documentation on the behalf of Lemont FillersMelissa S O'Sullivan, NP,as directed by  Lemont FillersMelissa S O'Sullivan, NP while in the presence of Lemont FillersMelissa S O'Sullivan, NP.   Janet [employer]ig

## 2022-01-31 NOTE — Patient Instructions (Signed)
Please complete keflex. Call if ulcer does not continue to resolve over the next week or so.

## 2022-01-31 NOTE — Progress Notes (Signed)
Subjective:   By signing my name below, I, [name], attest that this documentation has been prepared under the direction and in the presence of Debbrah Alar, NP 01/31/2022    Patient ID: [name], female    DOB: [birth year], 36 y.o.   MRN: [id number]  No chief complaint on file.   HPI Patient is in today for a office visit.   During last visit she was given anti-biotics to manage the boil on her breast. She reports the boil on her breast as drained and reduced in size. She has 2 more days of anti-biotics to take.    There are no preventive care reminders to display for this patient.  Past Medical History:  Diagnosis Date   Asthma    Iron deficiency anemia due to chronic blood loss 05/15/2018   Urticaria    Urticaria 04/21/2018   History of recurrent urticaria    Past Surgical History:  Procedure Laterality Date   CESAREAN SECTION  2009   CESAREAN SECTION  08/21/2012   Procedure: CESAREAN SECTION;  Surgeon: Daria Pastures, MD;  Location: [facility];  Service: Obstetrics;  Laterality: N/A;    Family History  Problem Relation Age of Onset   Fibromyalgia Mother    Deep vein thrombosis Mother        recurrent dvt   Eczema Maternal Uncle    Migraines Sister    Healthy Son    Healthy Daughter    Other Neg Hx     Social History   Socioeconomic History   Marital status: Significant Other    Spouse name: Not on file   Number of children: Not on file   Years of education: Not on file   Highest education level: Not on file  Occupational History   Occupation: nurse    Employer: [employer]  Tobacco Use   Smoking status: Never   Smokeless tobacco: Never  Vaping Use   Vaping Use: Never used  Substance and Sexual Activity   Alcohol use: Yes    Comment: socially 2 per week   Drug use: No   Sexual activity: Yes    Partners: Male    Birth control/protection: I.U.D.  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     Social Determinants of Health   Financial Resource Strain: Not on file  Food Insecurity: Not on file  Transportation Needs: Not on file  Physical Activity: Not on file  Stress: Not on file  Social Connections: Not on file  Intimate Partner Violence: Not on file    Outpatient Medications Prior to Visit  Medication Sig Dispense Refill   cephALEXin (KEFLEX) 500 MG capsule Take 1 capsule (500 mg total) by mouth 3 (three) times daily. 21 capsule 0   gabapentin (NEURONTIN) 100 MG capsule Take 1 capsule (100 mg total) by mouth 3 (three) times daily. 90 capsule 3   levonorgestrel (MIRENA, 52 MG,) 20 MCG/24HR IUD 1 Intra Uterine Device (1 each total) by Intrauterine route once for 1 dose. 1 each 0   meloxicam (MOBIC) 7.5 MG tablet TAKE 1 TABLET(7.5 MG) BY MOUTH DAILY 14 tablet 0   metroNIDAZOLE (METROGEL VAGINAL) 0.75 % vaginal gel Place 1 Applicatorful vaginally at bedtime for  5 days. 70 g 0   No facility-administered medications prior to visit.    No Known Allergies  Review of Systems  Skin:        (+)boil on right breast      Objective:    Physical Exam Constitutional:      General: She is not in acute distress.    Appearance: Normal appearance. She is not ill-appearing.  HENT:     Head: Normocephalic and atraumatic.     Right Ear: External ear normal.     Left Ear: External ear normal.  Eyes:     Extraocular Movements: Extraocular movements intact.     Pupils: Pupils are equal, round, and reactive to light.  Chest:     Comments: Superficial ulcer right breast 5 o'clock Skin:    General: Skin is warm and dry.  Neurological:     Mental Status: She is alert and oriented to person, place, and time.  Psychiatric:        Behavior: Behavior normal.    BP 109/66 (BP Location: Right Arm, Patient Position:  Sitting, Cuff Size: Large)    Pulse 96    Temp 98.7 F (37.1 C) (Oral)    Resp 16    Wt 221 lb (100.2 kg)    SpO2 100%    BMI 47.82 kg/m  Wt Readings from Last 3 Encounters:  01/31/22 221 lb (100.2 kg)  01/23/22 216 lb (98 kg)  09/19/21 204 lb (92.5 kg)       Assessment & Plan:   Problem List Items Addressed This Visit       Unprioritized   Breast abscess - Primary    Near resolution.  Pt is advised as follows:  Please complete keflex. Call if ulcer does not continue to resolve over the next week or so.           No orders of the defined types were placed in this encounter.   Cordella Register, NP, personally preformed the services described in this documentation.  All medical record entries made by the scribe were at my direction and in my presence.  I have reviewed the chart and discharge instructions (if applicable) and agree that the record reflects my personal performance and is accurate and complete. 01/31/2022   I,[name],acting as a scribe for Nance Pear, NP.,have documented all relevant documentation on the behalf of Nance Pear, NP,as directed by  Nance Pear, NP while in the presence of Nance Pear, NP.   Nance Pear, NP

## 2022-02-01 NOTE — Assessment & Plan Note (Signed)
Near resolution.  Pt is advised as follows:  Please complete keflex. Call if ulcer does not continue to resolve over the next week or so.

## 2022-02-02 ENCOUNTER — Ambulatory Visit: Payer: Medicaid Other | Admitting: Family

## 2022-02-13 ENCOUNTER — Encounter: Payer: Self-pay | Admitting: Family

## 2022-02-14 MED ORDER — ALBUTEROL SULFATE HFA 108 (90 BASE) MCG/ACT IN AERS: 2.0000 | INHALATION_SPRAY | Freq: Four times a day (QID) | RESPIRATORY_TRACT | 0 refills | Status: DC | PRN

## 2022-02-14 NOTE — Telephone Encounter (Signed)
I sent inhaler to [facility] for her. I think she needs an in person evaluation please with someone at our clinic or urgent care. ?

## 2022-02-14 NOTE — Telephone Encounter (Signed)
Patient advised per provider she needs in person evaluation at urgent care or in our office. She works from 8-5 and does not want to miss work. Patient advised to try to go to urgent care after work hours.  ?

## 2022-03-05 DIAGNOSIS — R430 Anosmia: Secondary | ICD-10-CM | POA: Diagnosis not present

## 2022-03-05 DIAGNOSIS — J343 Hypertrophy of nasal turbinates: Secondary | ICD-10-CM | POA: Diagnosis not present

## 2022-03-16 ENCOUNTER — Other Ambulatory Visit: Payer: Self-pay | Admitting: Family

## 2022-03-20 ENCOUNTER — Ambulatory Visit: Payer: 59 | Admitting: Registered"

## 2022-07-31 ENCOUNTER — Ambulatory Visit: Payer: Medicaid Other | Admitting: Family

## 2022-07-31 ENCOUNTER — Encounter: Payer: Self-pay | Admitting: Family

## 2022-09-14 ENCOUNTER — Encounter (INDEPENDENT_AMBULATORY_CARE_PROVIDER_SITE_OTHER): Payer: Medicaid Other | Admitting: Family

## 2022-09-14 DIAGNOSIS — N760 Acute vaginitis: Secondary | ICD-10-CM | POA: Insufficient documentation

## 2022-09-14 MED ORDER — METRONIDAZOLE 0.75 % VA GEL: 1.0000 | Freq: Every day | VAGINAL | 0 refills | Status: AC

## 2022-09-14 NOTE — Telephone Encounter (Signed)
Please see the MyChart message reply(ies) for my assessment and plan.  The patient gave consent for this Medical Advice Message and is aware that it may result in a bill to their insurance company as well as the possibility that this may result in a co-payment or deductible. They are an established patient, but are not seeking medical advice exclusively about a problem treated during an in person or video visit in the last 7 days. I did not recommend an in person or video visit within 7 days of my reply.  I spent a total of 5 minutes cumulative provider time within 7 days through MyChart messaging.  Sujay Grundman S O'Sullivan, NP  

## 2022-09-20 ENCOUNTER — Encounter (HOSPITAL_BASED_OUTPATIENT_CLINIC_OR_DEPARTMENT_OTHER): Payer: Self-pay | Admitting: Emergency Medicine

## 2022-09-20 ENCOUNTER — Emergency Department (HOSPITAL_BASED_OUTPATIENT_CLINIC_OR_DEPARTMENT_OTHER): Payer: Medicaid Other

## 2022-09-20 ENCOUNTER — Emergency Department (HOSPITAL_BASED_OUTPATIENT_CLINIC_OR_DEPARTMENT_OTHER)
Admission: EM | Admit: 2022-09-20 | Discharge: 2022-09-20 | Disposition: A | Discharge status: Home / Self Care | Payer: Medicaid Other | Attending: Emergency Medicine | Admitting: Emergency Medicine

## 2022-09-20 ENCOUNTER — Other Ambulatory Visit: Payer: Self-pay

## 2022-09-20 DIAGNOSIS — J45909 Unspecified asthma, uncomplicated: Secondary | ICD-10-CM | POA: Insufficient documentation

## 2022-09-20 DIAGNOSIS — R102 Pelvic and perineal pain: Secondary | ICD-10-CM | POA: Diagnosis not present

## 2022-09-20 DIAGNOSIS — N939 Abnormal uterine and vaginal bleeding, unspecified: Secondary | ICD-10-CM

## 2022-09-20 DIAGNOSIS — Z7951 Long term (current) use of inhaled steroids: Secondary | ICD-10-CM | POA: Diagnosis not present

## 2022-09-20 DIAGNOSIS — N83201 Unspecified ovarian cyst, right side: Secondary | ICD-10-CM | POA: Diagnosis not present

## 2022-09-20 DIAGNOSIS — N83202 Unspecified ovarian cyst, left side: Secondary | ICD-10-CM | POA: Diagnosis not present

## 2022-09-20 LAB — COMPREHENSIVE METABOLIC PANEL
ALT: 13 U/L (ref 0–44)
AST: 20 U/L (ref 15–41)
Albumin: 4 g/dL (ref 3.5–5.0)
Alkaline Phosphatase: 59 U/L (ref 38–126)
Anion gap: 8 (ref 5–15)
BUN: 11 mg/dL (ref 6–20)
CO2: 22 mmol/L (ref 22–32)
Calcium: 8.7 mg/dL — ABNORMAL LOW (ref 8.9–10.3)
Chloride: 105 mmol/L (ref 98–111)
Creatinine, Ser: 0.89 mg/dL (ref 0.44–1.00)
GFR, Estimated: >60 mL/min (ref >60)
Glucose, Bld: 80 mg/dL (ref 70–99)
Potassium: 3.5 mmol/L (ref 3.5–5.1)
Sodium: 135 mmol/L (ref 135–145)
Total Bilirubin: 0.5 mg/dL (ref 0.3–1.2)
Total Protein: 7.9 g/dL (ref 6.5–8.1)

## 2022-09-20 LAB — CBC WITH DIFFERENTIAL/PLATELET
Abs Immature Granulocytes: 0.01 10*3/uL (ref 0.00–0.07)
Basophils Absolute: 0.1 10*3/uL (ref 0.0–0.1)
Basophils Relative: 1 %
Eosinophils Absolute: 0.1 10*3/uL (ref 0.0–0.5)
Eosinophils Relative: 1 %
HCT: 42.5 % (ref 36.0–46.0)
Hemoglobin: 14.2 g/dL (ref 12.0–15.0)
Immature Granulocytes: 0 %
Lymphocytes Relative: 40 %
Lymphs Abs: 2.4 10*3/uL (ref 0.7–4.0)
MCH: 27.4 pg (ref 26.0–34.0)
MCHC: 33.4 g/dL (ref 30.0–36.0)
MCV: 81.9 fL (ref 80.0–100.0)
Monocytes Absolute: 0.6 10*3/uL (ref 0.1–1.0)
Monocytes Relative: 10 %
Neutro Abs: 2.9 10*3/uL (ref 1.7–7.7)
Neutrophils Relative %: 48 %
Platelets: 352 10*3/uL (ref 150–400)
RBC: 5.19 MIL/uL — ABNORMAL HIGH (ref 3.87–5.11)
RDW: 14.2 % (ref 11.5–15.5)
WBC: 6 10*3/uL (ref 4.0–10.5)
nRBC: 0 % (ref 0.0–0.2)

## 2022-09-20 LAB — URINALYSIS, ROUTINE W REFLEX MICROSCOPIC
Bilirubin Urine: NEGATIVE
Glucose, UA: NEGATIVE mg/dL
Ketones, ur: NEGATIVE mg/dL
Nitrite: NEGATIVE
Protein, ur: NEGATIVE mg/dL
Specific Gravity, Urine: 1.01 (ref 1.005–1.030)
pH: 6 (ref 5.0–8.0)

## 2022-09-20 LAB — URINALYSIS, MICROSCOPIC (REFLEX)

## 2022-09-20 LAB — WET PREP, GENITAL
Sperm: NONE SEEN
Trich, Wet Prep: NONE SEEN
WBC, Wet Prep HPF POC: >=10 — AB (ref <10)
Yeast Wet Prep HPF POC: NONE SEEN

## 2022-09-20 LAB — HCG, SERUM, QUALITATIVE: Preg, Serum: NEGATIVE

## 2022-09-20 NOTE — Discharge Instructions (Addendum)
You were seen in the emergency department today for vaginal bleeding.  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  This may be causing your bleeding.  You will likely need repeat imaging done with an OB/GYN in follow-up.  Please return to the emergency department if you have significantly worsening bleeding where you are changing your pad every hour or pass out.  I have sent a referral to OB/GYN.  I have also given you their information.  Please call in about 72 this hours if you do not receive a phone call for follow-up appointment.

## 2022-09-20 NOTE — ED Triage Notes (Signed)
Vaginal bleeding last night , passing clots , IUD . Abdominal cramping 1/10 .  Vaginal bleeding started after sexual intercourse .   Negative preg test today .

## 2022-09-20 NOTE — ED Notes (Signed)
D/c paperwork reviewed with pt, including f/u care. No questions or concerns at time of d/c. Pt ambulatory to ED exit, no assistance needed.

## 2022-09-20 NOTE — ED Provider Notes (Signed)
[facility] EMERGENCY DEPARTMENT Provider Note   CSN: [id number] Arrival date & time: 09/20/22  1814     History  Chief Complaint  Patient presents with   Vaginal Bleeding    [name] is a 36 y.o. female.  G2 P2, with past medical history of iron deficiency anemia, asthma who presents to the emergency department with vaginal bleeding.  States that yesterday evening she had sexual intercourse with female partner. States that afterward she had a small amount of spotting and thought she saw "tissue" come out using the restroom. States that after this she began having increased vaginal bleeding and passing clots. She states this has persisted through to today. She had very mild abdominal cramping this morning that has since dissipated. She denies other vaginal discharge, dysuria, hematuria, hematochezia or melena. She is using pads but has not had to cycle through many. Endorses feeling lightheaded last night when she was passing clots. She has IUD, so no definite LMP.    Vaginal Bleeding Associated symptoms: no dysuria, no fever and no nausea        Home Medications Prior to Admission medications   Medication Sig Start Date End Date Taking? Authorizing Provider  cephALEXin (KEFLEX) 500 MG capsule Take 1 capsule (500 mg total) by mouth 3 (three) times daily. 01/23/22   Debbrah Alar, NP  gabapentin (NEURONTIN) 100 MG capsule Take 1 capsule (100 mg total) by mouth 3 (three) times daily. 10/02/21   Debbrah Alar, NP  levonorgestrel (MIRENA, 52 MG,) 20 MCG/24HR IUD 1 Intra Uterine Device (1 each total) by Intrauterine route once for 1 dose. 03/06/18 05/27/18  Debbrah Alar, NP  meloxicam (MOBIC) 7.5 MG tablet TAKE 1 TABLET(7.5 MG) BY MOUTH DAILY 11/14/21   Debbrah Alar, NP  VENTOLIN HFA 108 (90 Base) MCG/ACT inhaler INHALE 2 PUFFS INTO THE LUNGS EVERY 6 HOURS AS NEEDED FOR WHEEZING OR SHORTNESS OF BREATH 03/16/22   Debbrah Alar, NP      Allergies     Patient has no known allergies.    Review of Systems   Review of Systems  Constitutional:  Negative for fever.  Gastrointestinal:  Negative for blood in stool, nausea and vomiting.  Genitourinary:  Positive for vaginal bleeding and vaginal pain. Negative for dysuria and hematuria.  All other systems reviewed and are negative.   Physical Exam Updated Vital Signs BP 121/79   Pulse 79   Temp 98.5 F (36.9 C)   Resp 15   Ht 4' 9.5" (1.461 m)   Wt 81.6 kg   SpO2 99%   BMI 38.28 kg/m  Physical Exam Vitals and nursing note reviewed. Exam conducted with a chaperone present.  Constitutional:      General: She is not in acute distress.    Appearance: Normal appearance. She is not ill-appearing or toxic-appearing.  HENT:     Head: Normocephalic and atraumatic.  Eyes:     General: No scleral icterus.    Extraocular Movements: Extraocular movements intact.  Cardiovascular:     Pulses: Normal pulses.     Heart sounds: No murmur heard. Pulmonary:     Effort: Pulmonary effort is normal. No respiratory distress.     Breath sounds: Normal breath sounds.  Abdominal:     General: Bowel sounds are normal. There is no distension.     Palpations: Abdomen is soft.     Tenderness: There is no abdominal tenderness. There is no guarding.  Genitourinary:    General: Normal vulva.  Exam position: Lithotomy position.     Vagina: No signs of injury. Bleeding present.     Cervix: Cervical bleeding present. No cervical motion tenderness.     Adnexa:        Right: Tenderness present.        Left: No tenderness.       Comments: External GU exam is normal.  There is some blood at the introitus.  On insertion of the speculum there appears to be light-colored vaginal bleeding.  I inspected the vaginal wall and do not see a laceration or other injury.  I was able to visualize the cervix which does appear to have some bleeding coming from the os.  There is no clot formation.  There is no hemorrhage.   She does have right adnexal tenderness on bimanual exam.  No CMT. Musculoskeletal:     Cervical back: Neck supple.  Skin:    General: Skin is warm and dry.     Capillary Refill: Capillary refill takes less than 2 seconds.     Findings: No rash.  Neurological:     General: No focal deficit present.     Mental Status: She is alert and oriented to person, place, and time. Mental status is at baseline.  Psychiatric:        Mood and Affect: Mood normal.        Behavior: Behavior normal.        Thought Content: Thought content normal.        Judgment: Judgment normal.     ED Results / Procedures / Treatments   Labs (all labs ordered are listed, but only abnormal results are displayed) Labs Reviewed  WET PREP, GENITAL - Abnormal; Notable for the following components:      Result Value   Clue Cells Wet Prep HPF POC PRESENT (*)    WBC, Wet Prep HPF POC >=10 (*)    All other components within normal limits  URINALYSIS, ROUTINE W REFLEX MICROSCOPIC - Abnormal; Notable for the following components:   APPearance HAZY (*)    Hgb urine dipstick MODERATE (*)    Leukocytes,Ua SMALL (*)    All other components within normal limits  COMPREHENSIVE METABOLIC PANEL - Abnormal; Notable for the following components:   Calcium 8.7 (*)    All other components within normal limits  CBC WITH DIFFERENTIAL/PLATELET - Abnormal; Notable for the following components:   RBC 5.19 (*)    All other components within normal limits  URINALYSIS, MICROSCOPIC (REFLEX) - Abnormal; Notable for the following components:   Bacteria, UA FEW (*)    All other components within normal limits  HCG, SERUM, QUALITATIVE  GC/CHLAMYDIA PROBE AMP ([facility]) NOT AT [facility]    EKG None  Radiology US PELVIC COMPLETE W TRANSVAGINAL AND TORSION R/O  Result Date: 09/20/2022 CLINICAL DATA:  Vaginal bleeding and adnexal pain. EXAM: TRANSABDOMINAL AND TRANSVAGINAL ULTRASOUND OF PELVIS DOPPLER ULTRASOUND OF OVARIES TECHNIQUE: Both  transabdominal and transvaginal ultrasound examinations of the pelvis were performed. Transabdominal technique was performed for global imaging of the pelvis including uterus, ovaries, adnexal regions, and pelvic cul-de-sac. It was necessary to proceed with endovaginal exam following the transabdominal exam to visualize the ovaries and endometrium. Color and duplex Doppler ultrasound was utilized to evaluate blood flow to the ovaries. COMPARISON:  07/20/2014 FINDINGS: Uterus Measurements: 6.8 x 3.8 x 4.1 cm = volume: 55 mL. Uterus is anteverted. No focal myometrial mass lesions identified. Diffusely heterogeneous parenchymal echotexture. Small nabothian cysts in the  cervix. Endometrium Thickness: 3 mm. Echogenic stripe demonstrated within the endometrium consistent with intrauterine device. Positioning appears appropriate. Right ovary Measurements: 3.4 x 2 x 2.6 cm = volume: 4 mL. Complex cyst measuring 1.7 x 1.3 x 1.4 cm, likely hemorrhagic cyst or possibly dermoid cyst. Left ovary Measurements: 3.8 x 2.3 x 2.8 cm = volume: 13 mL. Simple appearing cyst measuring 2.3 x 1.7 x 1.8 cm. Pulsed Doppler evaluation of both ovaries demonstrates normal low-resistance arterial and venous waveforms. Other findings No abnormal free fluid. IMPRESSION: 1. Heterogeneous myometrial echotexture without focal mass, possibly adenomyosis. 2. Intrauterine device appears in place.  No endometrial thickening. 3. Complex cystic lesion on the right ovary measuring 1.7 cm, likely hemorrhagic cyst or possibly dermoid cyst. Based on size, in a premenopausal patient, no imaging follow-up is indicated. 4. Simple cyst on the left ovary measuring 2.3 cm diameter. No follow-up is indicated. Electronically Signed   By: Burman Nieves M.D.   On: 09/20/2022 21:44    Procedures Pelvic exam  Date/Time: 09/20/2022 7:31 PM  Performed by: Cristopher Peru, PA-C Authorized by: Cristopher Peru, PA-C  Consent: Verbal consent obtained. Risks and  benefits: risks, benefits and alternatives were discussed Consent given by: patient Patient understanding: patient states understanding of the procedure being performed Patient identity confirmed: verbally with patient Patient tolerance: patient tolerated the procedure well with no immediate complications     Medications Ordered in ED Medications - No data to display  ED Course/ Medical Decision Making/ A&P                           Medical Decision Making Amount and/or Complexity of Data Reviewed Labs: ordered. Radiology: ordered.  This patient presents to the ED with chief complaint(s) of vaginal bleeding with pertinent past medical history of asthma, iron deficiency anemia which further complicates the presenting complaint. The complaint involves an extensive differential diagnosis and also carries with it a high risk of complications and morbidity.    The differential diagnosis includes abnormal uterine bleeding, ectopic pregnancy, molar pregnancy, coagulopathy, trauma, bacterial infection, etc.  Additional history obtained: Additional history obtained from  none available Records reviewed Care Everywhere/External Records and Primary Care Documents  ED Course and Reassessment: 36 year old female who presents to the emergency department with vaginal bleeding.  Physical exam as noted above on GU exam with chaperone present.  Given that she had right adnexal tenderness and vaginal bleeding obtained ultrasound with basic lab work including STD testing. CBC without acute anemia.  She does not have profuse hemorrhage on exam.  No history of coagulopathy. Not pregnant so doubt ectopic. UA without UTI  Obtain ultrasound which shows that she has heterogenous myometrial echotexture for possible adenomyosis. GC and Chlamydia is pending.  She does not wish to be treated for this. She does have clue cells but has been currently on metronidazole and has some leftover so we will continue to  use metronidazole gel.  Unclear etiology of her abnormal uterine bleeding although I suspect this may be adenomyosis.  May have been brought on by BV versus sexual intercourse.  She is not having active hemorrhage requiring transfusion or transfer to [facility].  She has stable hemoglobin.  She is not symptomatically orthostatic or unstable. She does not have OB/GYN so I will refer her to 1 for repeat imaging at a later time.  Do not feel that she needs oral Megace, etc.  She is currently on IUD.  Encouraged watchful waiting.  She is instructed return should she have significant increase in vaginal bleeding that makes her symptomatic or she is changing a pad every hour.  She verbalized understanding.  Otherwise feel that she is appropriate for discharge with outpatient follow-up.  She is agreeable to this plan. Safe for discharge.  Independent labs interpretation:  The following labs were independently interpreted:  CBC within normal limits CMP within normal limits Not pregnant UA without UTI Positive clue cells GC chlamydia pending  Independent visualization of imaging: - I independently visualized the following imaging with scope of interpretation limited to determining acute life threatening conditions related to emergency care: Complete pelvic ultrasound, which revealed heterogenous myometrial echotexture without focal mass, possibly myosis.  Possible hemorrhagic right ovarian cyst  Consultation: - Consulted or discussed management/test interpretation w/ external professional: Not indicated  Consideration for admission or further workup: Not indicated Social Determinants of health: No OB/GYN, given referral Final Clinical Impression(s) / ED Diagnoses Final diagnoses:  Abnormal uterine bleeding (AUB)    Rx / DC Orders ED Discharge Orders          Ordered    Ambulatory referral to Obstetrics / Gynecology        09/20/22 2224              Cristopher Peru, PA-C 09/20/22  2302    Arby Barrette, MD 09/21/22 1528

## 2022-09-21 LAB — GC/CHLAMYDIA PROBE AMP (~~LOC~~) NOT AT ARMC
Chlamydia: NEGATIVE
Comment: NEGATIVE
Comment: NORMAL
Neisseria Gonorrhea: NEGATIVE

## 2022-10-01 ENCOUNTER — Ambulatory Visit: Payer: Medicaid Other | Admitting: Obstetrics and Gynecology

## 2022-10-01 ENCOUNTER — Other Ambulatory Visit (HOSPITAL_COMMUNITY)
Admission: RE | Admit: 2022-10-01 | Discharge: 2022-10-01 | Disposition: A | Discharge status: Home / Self Care | Payer: Medicaid Other | Source: Ambulatory Visit | Attending: Obstetrics and Gynecology | Admitting: Obstetrics and Gynecology

## 2022-10-01 VITALS — BP 119/76 | HR 102 | Ht <= 58 in | Wt 180.0 lb

## 2022-10-01 DIAGNOSIS — N898 Other specified noninflammatory disorders of vagina: Secondary | ICD-10-CM

## 2022-10-01 DIAGNOSIS — R102 Pelvic and perineal pain: Secondary | ICD-10-CM | POA: Diagnosis not present

## 2022-10-01 DIAGNOSIS — N939 Abnormal uterine and vaginal bleeding, unspecified: Secondary | ICD-10-CM

## 2022-10-01 NOTE — Progress Notes (Signed)
CLINIC ENCOUNTER NOTE  History:  36 y.o. [id number] here today for ED follow up, re: AUB and possible fibroids  Earlier this month had an episode of bleeding x 2 days that started after intercourse. Has IUD in place an typically has irregular spotting, but no heavy menstrual bleeding. During episode of bleeding, had just completed metrogel for BV. During bleeding, passed clots and "tissue", no pain present.  Baseline has vaginal pressure, unsure if there is prolapse as she feels small bulge. Husband reports vaginal pressure present x several years as well as left sided sciatic pain. No sharp pain. Pressure intermittently presnt with penetrative intercourse and afterwards, resolves spontaneously. Menses are nonpainful and not heavy at baseline. Currently in school, works as Marine scientist (previously L&D). 2  pregnancies - 1st emergent cesarean then 2nd urgent cesarean.  Denies a history of constipation, diarrhea, dysuria, hematuria, and recurrent UTIs.  Denies other surgeries.  This is her third IUD, noticed increased discomfort with IUD in place.  Not sure if pain/pressure has been related to IUD or other causes.  IUD is in place for contraceptive purposes only.   Pt reports continued metrogel use following ED visit due to positive wet prep.   Seen in ED 09/20/2022 with vaginal bleeding following intercourse. Likely vaginitis - clue cells and WBC on wet prep. GC/CT neg, CBC wnl, CMP wnl TVUS: IUD in appropriate place, no masses, diffuse heterogeneity poss adenomyosis, normal ovaries  Past Medical History:  Diagnosis Date   Asthma    Iron deficiency anemia due to chronic blood loss 05/15/2018   Urticaria    Urticaria 04/21/2018   History of recurrent urticaria    Past Surgical History:  Procedure Laterality Date   CESAREAN SECTION  2009   CESAREAN SECTION  08/21/2012   Procedure: CESAREAN SECTION;  Surgeon: Daria Pastures, MD;  Location: [facility];  Service: Obstetrics;  Laterality: N/A;    The  following portions of the patient's history were reviewed and updated as appropriate: allergies, current medications, past family history, past medical history, past social history, past surgical history and problem list.   Health Maintenance:  Normal pap and negative HRHPV on 01/2022.   Review of Systems:  Pertinent items are noted in HPI. Comprehensive review of systems was otherwise negative.   Objective:  Physical Exam BP 119/76   Pulse (!) 102   Ht 4' 9.5" (1.461 m)   Wt 180 lb (81.6 kg)   BMI 38.28 kg/m    Physical Exam Vitals and nursing note reviewed. Exam conducted with a chaperone present.  Constitutional:      Appearance: Normal appearance.  HENT:     Head: Normocephalic and atraumatic.  Cardiovascular:     Rate and Rhythm: Normal rate and regular rhythm.  Pulmonary:     Effort: Pulmonary effort is normal.     Breath sounds: Normal breath sounds.  Abdominal:     General: Bowel sounds are normal.     Palpations: Abdomen is soft.     Comments: + carnett L upper and R mid abdomen Numbness along pfnannenstiel scar  No allodynia throughout abdomen    Genitourinary:    General: Normal vulva.     Exam position: Lithotomy position.     Urethra: No prolapse.     Vagina: Normal.     Cervix: No cervical motion tenderness or friability.     Uterus: Normal.      Adnexa: Right adnexa normal and left adnexa normal.     Comments:  Normal vulva sensation bilaterally Non tender superficial pelvic floor muscles No allodynia at introitus  - CST 2/10 levator ani 0/10 ischiococcygeous 2/10 obturator internus 1/10 levator ani 0/10 ischiococcygeous 0/10 obturator internus  5/5 kegel strength No CMT Suprapubic tenderness on bimanual  IUD strings visualized Normal cervix Small volume discharge No blood, lesions or lacerations Minimal descent of anterior vaginal wall with valsalva Skin:    General: Skin is warm and dry.  Neurological:     General: No focal deficit  present.     Mental Status: She is alert and oriented to person, place, and time.  Psychiatric:        Mood and Affect: Mood normal.        Behavior: Behavior normal.        Thought Content: Thought content normal.        Judgment: Judgment normal.      Labs and Imaging US PELVIC COMPLETE W TRANSVAGINAL AND TORSION R/O  Result Date: 09/20/2022 CLINICAL DATA:  Vaginal bleeding and adnexal pain. EXAM: TRANSABDOMINAL AND TRANSVAGINAL ULTRASOUND OF PELVIS DOPPLER ULTRASOUND OF OVARIES TECHNIQUE: Both transabdominal and transvaginal ultrasound examinations of the pelvis were performed. Transabdominal technique was performed for global imaging of the pelvis including uterus, ovaries, adnexal regions, and pelvic cul-de-sac. It was necessary to proceed with endovaginal exam following the transabdominal exam to visualize the ovaries and endometrium. Color and duplex Doppler ultrasound was utilized to evaluate blood flow to the ovaries. COMPARISON:  07/20/2014 FINDINGS: Uterus Measurements: 6.8 x 3.8 x 4.1 cm = volume: 55 mL. Uterus is anteverted. No focal myometrial mass lesions identified. Diffusely heterogeneous parenchymal echotexture. Small nabothian cysts in the cervix. Endometrium Thickness: 3 mm. Echogenic stripe demonstrated within the endometrium consistent with intrauterine device. Positioning appears appropriate. Right ovary Measurements: 3.4 x 2 x 2.6 cm = volume: 4 mL. Complex cyst measuring 1.7 x 1.3 x 1.4 cm, likely hemorrhagic cyst or possibly dermoid cyst. Left ovary Measurements: 3.8 x 2.3 x 2.8 cm = volume: 13 mL. Simple appearing cyst measuring 2.3 x 1.7 x 1.8 cm. Pulsed Doppler evaluation of both ovaries demonstrates normal low-resistance arterial and venous waveforms. Other findings No abnormal free fluid. IMPRESSION: 1. Heterogeneous myometrial echotexture without focal mass, possibly adenomyosis. 2. Intrauterine device appears in place.  No endometrial thickening. 3. Complex cystic  lesion on the right ovary measuring 1.7 cm, likely hemorrhagic cyst or possibly dermoid cyst. Based on size, in a premenopausal patient, no imaging follow-up is indicated. 4. Simple cyst on the left ovary measuring 2.3 cm diameter. No follow-up is indicated. Electronically Signed   By: Lucienne Capers M.D.   On: 09/20/2022 21:44       Assessment & Plan:  1. Abnormal uterine bleeding (AUB) Bleeding possibly related to vaginitis.  Recollected a vaginitis swab today.  Discussed continued surveillance of bleeding patterns.  Reviewed presence of suspected adenomyosis on ultrasound, no fibroids noted.  Ultrasound demonstrates IUD in correct position.  IUD to remain in place at this time, but should patient decide would like it removed and switch to another form of contraceptive we will discuss at that time.  2. Vaginal discharge If positive, will treat accordingly. - Cervicovaginal ancillary only( [facility])  3. Pelvic pain Reviewed examination in the presence of pelvic and abdominal wall myalgia.  Pain may be secondary to ergonomics of occupation.  Recommend pelvic physical therapy to address vaginal pressure and dyspareunia.  Will return in 3 to 4 months to reassess pain symptoms. - Ambulatory referral to  Physical Therapy   Darliss Cheney, MD [facility]

## 2022-10-01 NOTE — Progress Notes (Signed)
Patient has Mirena in place. Last pap 01/23/2022 Kathrene Alu RN

## 2022-10-01 NOTE — Patient Instructions (Signed)
Pelvic physical therapy referral Follow up vaginal swab  Return in 4 months If you have bleeding after intercourse again, let us know

## 2022-10-02 LAB — CERVICOVAGINAL ANCILLARY ONLY
Bacterial Vaginitis (gardnerella): NEGATIVE
Candida Glabrata: NEGATIVE
Candida Vaginitis: POSITIVE — AB
Comment: NEGATIVE
Comment: NEGATIVE
Comment: NEGATIVE

## 2022-10-04 ENCOUNTER — Encounter: Payer: Self-pay | Admitting: Physical Therapy

## 2022-10-04 ENCOUNTER — Encounter: Payer: Self-pay | Admitting: Obstetrics and Gynecology

## 2022-10-04 ENCOUNTER — Ambulatory Visit: Payer: Medicaid Other | Attending: Obstetrics and Gynecology | Admitting: Physical Therapy

## 2022-10-04 ENCOUNTER — Other Ambulatory Visit: Payer: Self-pay | Admitting: Obstetrics and Gynecology

## 2022-10-04 ENCOUNTER — Other Ambulatory Visit: Payer: Self-pay

## 2022-10-04 DIAGNOSIS — R102 Pelvic and perineal pain: Secondary | ICD-10-CM | POA: Insufficient documentation

## 2022-10-04 DIAGNOSIS — M6281 Muscle weakness (generalized): Secondary | ICD-10-CM

## 2022-10-04 DIAGNOSIS — R293 Abnormal posture: Secondary | ICD-10-CM

## 2022-10-04 DIAGNOSIS — M5459 Other low back pain: Secondary | ICD-10-CM

## 2022-10-04 DIAGNOSIS — N898 Other specified noninflammatory disorders of vagina: Secondary | ICD-10-CM

## 2022-10-04 MED ORDER — FLUCONAZOLE 150 MG PO TABS: 150.0000 mg | ORAL_TABLET | Freq: Once | ORAL | 2 refills | Status: AC

## 2022-10-04 NOTE — Therapy (Signed)
OUTPATIENT PHYSICAL THERAPY FEMALE PELVIC EVALUATION   Patient Name: [name] MRN: [id number] DOB:09-17-1986, 36 y.o., female Today's Date: 10/04/22    PT End of Session - 10/04/22 1624     Visit Number 1    Date for PT Re-Evaluation 12/27/22    Authorization Type [place] medicaid    PT Start Time 1620    PT Stop Time 1701    PT Time Calculation (min) 41 min    Activity Tolerance Patient tolerated treatment well    Behavior During Therapy [facility] for tasks assessed/performed             Past Medical History:  Diagnosis Date   Asthma    Iron deficiency anemia due to chronic blood loss 05/15/2018   Urticaria    Urticaria 04/21/2018   History of recurrent urticaria   Past Surgical History:  Procedure Laterality Date   CESAREAN SECTION  2009   CESAREAN SECTION  08/21/2012   Procedure: CESAREAN SECTION;  Surgeon: Loney Laurence, MD;  Location: [facility];  Service: Obstetrics;  Laterality: N/A;   Patient Active Problem List   Diagnosis Date Noted   Acute vaginitis 09/14/2022   Breast abscess 01/23/2022   Anosmia 01/23/2022   Rheumatoid arthritis (HCC) 01/23/2022   Preventative health care 01/23/2022   Paresthesia 09/19/2021   Elevated sed rate 05/19/2018   Iron deficiency anemia due to chronic blood loss 05/15/2018   Primary osteoarthritis of both knees 04/21/2018   Chondromalacia of patella 04/21/2018   History of asthma 04/21/2018   Morbid obesity (HCC) 08/09/2010   ASTHMA, CHILDHOOD 08/09/2010   BREAST TENDERNESS 08/09/2010    PCP:   Sandford Craze, NP    REFERRING PROVIDER: Lorriane Shire, MD  REFERRING DIAG: R10.2 (ICD-10-CM) - Pelvic pain  THERAPY DIAG:  Muscle weakness (generalized) - Plan: PT plan of care cert/re-cert  Other low back pain - Plan: PT plan of care cert/re-cert  Abnormal posture  Rationale for Evaluation and Treatment Rehabilitation  ONSET DATE: over a year for pelvic pressure; back pain for 2 years  SUBJECTIVE:                                                                                                                                                                                            SUBJECTIVE STATEMENT: I have been feeling pressure vaginally and probably more when sitting, toileting, intercourse and sometimes.  I am on semiglutide Fluid intake: Yes: water 16 oz/day    PAIN:  Are you having pain? Yes NPRS scale: 3/10 (back); 6/10 (pelvic pressure) Pain location:  back  Pain type: radiating, numb and tingling down  the left side buttock to foot Pain description: intermittent   Aggravating factors: working out heavily Relieving factors: lying down or tilting back; pushing it back in  PRECAUTIONS: None  WEIGHT BEARING RESTRICTIONS: No  FALLS:  Has patient fallen in last 6 months? No  LIVING ENVIRONMENT: Lives with: lives with their family, lives with their spouse, and 2 children Lives in: House/apartment   OCCUPATION: nurse - school nurse  PLOF: Independent  PATIENT GOALS: not feel uncomfortable with daily activities  PERTINENT HISTORY:  2 c-section Sexual abuse: No  BOWEL MOVEMENT: Pain with bowel movement: No Type of bowel movement:Strain No, few months on semiglutide causes less frequent bowel movements   URINATION: Pain with urination: No Fully empty bladder: Yes: sometimes feels like it isn't but hard to tell Stream: Strong Urgency: No Frequency: normal Leakage:  no Pads: No  INTERCOURSE: Pain with intercourse: Initial Penetration and After Intercourse  Ability to have vaginal penetration:  Yes:   Climax: No Marinoff Scale: 3/3 and sometimes have to find the right position  PREGNANCY: Vaginal deliveries 0 Tearing  C-section deliveries 2 Currently pregnant No  PROLAPSE: Cystocele     OBJECTIVE:   DIAGNOSTIC FINDINGS:    PATIENT SURVEYS:    PFIQ-7 = 24  COGNITION: Overall cognitive status: Within functional limits for tasks  assessed     SENSATION: Light touch: Appears intact Proprioception:   MUSCLE LENGTH: Hamstrings: Right 90 deg; Left 90 deg+P in hip Thomas test:   LUMBAR SPECIAL TESTS:  Straight leg raise test: only after 90 deg some pain in buttock  FUNCTIONAL TESTS:  Standing no arch bil  GAIT:  Comments: WFL   POSTURE: rounded shoulders, increased lumbar lordosis, and anterior pelvic tilt  PELVIC ALIGNMENT: normal LUMBARAROM/PROM:  A/PROM A/PROM  eval  Flexion Normal +pain  Extension Normal pain relief  Right lateral flexion   Left lateral flexion   Right rotation   Left rotation    (Blank rows = not tested)  LOWER EXTREMITY ROM:  Passive ROM Right eval Left eval  Hip flexion [facility]  [facility]   Hip extension    Hip abduction    Hip adduction    Hip internal rotation    Hip external rotation    Knee flexion    Knee extension    Ankle dorsiflexion    Ankle plantarflexion    Ankle inversion    Ankle eversion     (Blank rows = not tested)  LOWER EXTREMITY MMT:  MMT Right eval Left eval  Hip flexion    Hip extension    Hip abduction    Hip adduction    Hip internal rotation    Hip external rotation    Knee flexion    Knee extension    Ankle dorsiflexion    Ankle plantarflexion    Ankle inversion    Ankle eversion     PALPATION:   General  lumbar extensors tight                External Perineal Exam normal with descending perineal body and descending urethra                             Internal Pelvic Floor TTP obturator internus Left; muscle fasciculations and tight throughout the left side  Patient confirms identification and approves PT to assess internal pelvic floor and treatment Yes No emotional/communication barriers or cognitive limitation. Patient is motivated to learn. Patient understands  and agrees with treatment goals and plan. PT explains patient will be examined in standing, sitting, and lying down to see how their muscles and joints work. When they  are ready, they will be asked to remove their underwear so PT can examine their perineum. The patient is also given the option of providing their own chaperone as one is not provided in our facility. The patient also has the right and is explained the right to defer or refuse any part of the evaluation or treatment including the internal exam. With the patient's consent, PT will use one gloved finger to gently assess the muscles of the pelvic floor, seeing how well it contracts and relaxes and if there is muscle symmetry. After, the patient will get dressed and PT and patient will discuss exam findings and plan of care. PT and patient discuss plan of care, schedule, attendance policy and HEP activities.   PELVIC MMT:   MMT eval  Vaginal 3/5 x 10 reps; hold 17 sec but only 2/5 and was unsteady  Internal Anal Sphincter   External Anal Sphincter   Puborectalis   Diastasis Recti   (Blank rows = not tested)        TONE: high  PROLAPSE: Anterior and posterior wall - more from anterior wall  TODAY'S TREATMENT:                                                                                                                   DATE: 10/04/22 EVAL    PATIENT EDUCATION:  Education details: educated on POC Person educated: Patient Education method: Explanation Education comprehension: verbalized understanding  HOME EXERCISE PROGRAM: None given today  ASSESSMENT:  CLINICAL IMPRESSION: Patient is a 36 y.o. female who was seen today for physical therapy evaluation and treatment for pelvic and back pain and feeling symptoms of POP. Pt has been feeling these symptoms for years now.  She has had 2 c-sections.  Pt has lost weight and used to do weight lifting but felt more pain in her leg so has not been doing that anymore.  Pt has pain with intercourse.  She has a lot of pelvic floor tension and left side of pelvic floor is tight and TTP as mentioned above.  Pelvic floor muscles are fairly strong, but  lack endurance.  Pt has postural weakness throughout the trunk as observed in assessment.  She also experiences a direction preference to lumbar extension with symptom relief and increased symptoms with flexion.  She has hip weakness Lt>Rt.  Pt will benefit from skilled PT to address all the impairments in order to restore maximum function and pain free activities.  OBJECTIVE IMPAIRMENTS: decreased endurance, decreased knowledge of condition, decreased strength, increased muscle spasms, postural dysfunction, and pain.   ACTIVITY LIMITATIONS: bending, toileting, and exercising  PARTICIPATION LIMITATIONS: interpersonal relationship and community activity  PERSONAL FACTORS: 1 comorbidity: 2 c-sections  are also affecting patient's functional outcome.   REHAB POTENTIAL: Excellent  CLINICAL DECISION MAKING: Evolving/moderate complexity  EVALUATION COMPLEXITY: Moderate  GOALS: Goals reviewed with patient? Yes  SHORT TERM GOALS: Target date: 11/01/2022  Ind with initial HEP Baseline: Goal status: INITIAL   LONG TERM GOALS: Target date: 12/27/2022   Pt will be independent with advanced HEP to maintain improvements made throughout therapy  Baseline: doesn't know Goal status: INITIAL  2.  Pt will have 1/3 or lower score of Marinoff scale  Baseline: 3/3 Goal status: INITIAL  3.  Pt will report 10 or less on PFIQ due to improved symptoms from prolapse Baseline: 24 Goal status: INITIAL  4.  Pt will report at least 75% less pain with normal daily activities due to improved postural strength Baseline:  Goal status: INITIAL  5.  Pt will report 50% less severity of prolapse after toileting or intercourse Baseline:  Goal status: INITIAL   PLAN:  PT FREQUENCY: 1x/week  PT DURATION: 12 weeks  PLANNED INTERVENTIONS: Therapeutic exercises, Therapeutic activity, Neuromuscular re-education, Balance training, Gait training, Patient/Family education, Self Care, Joint mobilization, Dry  Needling, Electrical stimulation, Cryotherapy, Moist heat, Taping, Traction, Biofeedback, Manual therapy, and Re-evaluation  PLAN FOR NEXT SESSION: internal STM to trigger points on the left side and educate on stretching and breathing, gentle pelvic and core strength, lumbar and gluteal release/cupping   Brayton Caves Ken Bonn, PT 10/05/2022, 1:39 PM

## 2022-10-19 ENCOUNTER — Ambulatory Visit: Payer: Medicaid Other | Attending: Obstetrics and Gynecology | Admitting: Physical Therapy

## 2022-10-19 ENCOUNTER — Encounter: Payer: Self-pay | Admitting: Physical Therapy

## 2022-10-19 DIAGNOSIS — R293 Abnormal posture: Secondary | ICD-10-CM | POA: Insufficient documentation

## 2022-10-19 DIAGNOSIS — M5459 Other low back pain: Secondary | ICD-10-CM | POA: Insufficient documentation

## 2022-10-19 DIAGNOSIS — M6281 Muscle weakness (generalized): Secondary | ICD-10-CM | POA: Diagnosis not present

## 2022-10-19 NOTE — Therapy (Signed)
OUTPATIENT PHYSICAL THERAPY FEMALE PELVIC EVALUATION   Patient Name: [name] MRN: [id number] DOB:[birth year], 36 y.o., female Today's Date: 10/04/22    PT End of Session - 10/19/22 0810     Visit Number 2    Date for PT Re-Evaluation 12/27/22    Authorization Type [place] medicaid    PT Start Time [phone]   late   PT Stop Time 0857    PT Time Calculation (min) 46 min    Activity Tolerance Patient tolerated treatment well    Behavior During Therapy [facility] for tasks assessed/performed              Past Medical History:  Diagnosis Date   Asthma    Iron deficiency anemia due to chronic blood loss 05/15/2018   Urticaria    Urticaria 04/21/2018   History of recurrent urticaria   Past Surgical History:  Procedure Laterality Date   CESAREAN SECTION  2009   CESAREAN SECTION  08/21/2012   Procedure: CESAREAN SECTION;  Surgeon: Loney Laurence, MD;  Location: [facility];  Service: Obstetrics;  Laterality: N/A;   Patient Active Problem List   Diagnosis Date Noted   Acute vaginitis 09/14/2022   Breast abscess 01/23/2022   Anosmia 01/23/2022   Rheumatoid arthritis (HCC) 01/23/2022   Preventative health care 01/23/2022   Paresthesia 09/19/2021   Elevated sed rate 05/19/2018   Iron deficiency anemia due to chronic blood loss 05/15/2018   Primary osteoarthritis of both knees 04/21/2018   Chondromalacia of patella 04/21/2018   History of asthma 04/21/2018   Morbid obesity (HCC) 08/09/2010   ASTHMA, CHILDHOOD 08/09/2010   BREAST TENDERNESS 08/09/2010    PCP:   Sandford Craze, NP    REFERRING PROVIDER: Lorriane Shire, MD  REFERRING DIAG: R10.2 (ICD-10-CM) - Pelvic pain  THERAPY DIAG:  Muscle weakness (generalized)  Other low back pain  Abnormal posture  Rationale for Evaluation and Treatment Rehabilitation  ONSET DATE: over a year for pelvic pressure; back pain for 2 years  SUBJECTIVE:                                                                                                                                                                                            SUBJECTIVE STATEMENT: She is having bleeding with intercourse for about two days. She is still having pain with intercourse and it took a while to get comfortable with minimal pain after. She is sill having the pressure after with bowel movements as well as urination. Fluid intake: Yes: water 16 oz/day    PAIN:  Are you having pain? No  EVALUATION PAIN:  Are you having  pain? Yes NPRS scale: 3/10 (back); 6/10 (pelvic pressure) Pain location:  back  Pain type: radiating, numb and tingling down the left side buttock to foot Pain description: intermittent   Aggravating factors: working out heavily Relieving factors: lying down or tilting back; pushing it back in  PRECAUTIONS: None  WEIGHT BEARING RESTRICTIONS: No  FALLS:  Has patient fallen in last 6 months? No  LIVING ENVIRONMENT: Lives with: lives with their family, lives with their spouse, and 2 children Lives in: House/apartment   OCCUPATION: nurse - school nurse  PLOF: Independent  PATIENT GOALS: not feel uncomfortable with daily activities  PERTINENT HISTORY:  2 c-section Sexual abuse: No  BOWEL MOVEMENT: Pain with bowel movement: No Type of bowel movement:Strain No, few months on semiglutide causes less frequent bowel movements   URINATION: Pain with urination: No Fully empty bladder: Yes: sometimes feels like it isn't but hard to tell Stream: Strong Urgency: No Frequency: normal Leakage:  no Pads: No  INTERCOURSE: Pain with intercourse: Initial Penetration and After Intercourse  Ability to have vaginal penetration:  Yes:   Climax: No Marinoff Scale: 3/3 and sometimes have to find the right position  PREGNANCY: Vaginal deliveries 0 Tearing  C-section deliveries 2 Currently pregnant No  PROLAPSE: Cystocele     OBJECTIVE:   DIAGNOSTIC FINDINGS:    PATIENT SURVEYS:    PFIQ-7 =  24  COGNITION: Overall cognitive status: Within functional limits for tasks assessed     SENSATION: Light touch: Appears intact Proprioception:   MUSCLE LENGTH: Hamstrings: Right 90 deg; Left 90 deg+P in hip Thomas test:   LUMBAR SPECIAL TESTS:  Straight leg raise test: only after 90 deg some pain in buttock  FUNCTIONAL TESTS:  Standing no arch bil  GAIT:  Comments: WFL   POSTURE: rounded shoulders, increased lumbar lordosis, and anterior pelvic tilt  PELVIC ALIGNMENT: normal LUMBARAROM/PROM:  A/PROM A/PROM  eval  Flexion Normal +pain  Extension Normal pain relief  Right lateral flexion   Left lateral flexion   Right rotation   Left rotation    (Blank rows = not tested)  LOWER EXTREMITY ROM:  Passive ROM Right eval Left eval  Hip flexion [facility]  [facility]   Hip extension    Hip abduction    Hip adduction    Hip internal rotation    Hip external rotation    Knee flexion    Knee extension    Ankle dorsiflexion    Ankle plantarflexion    Ankle inversion    Ankle eversion     (Blank rows = not tested)  LOWER EXTREMITY MMT:  MMT Right eval Left eval  Hip flexion    Hip extension    Hip abduction    Hip adduction    Hip internal rotation    Hip external rotation    Knee flexion    Knee extension    Ankle dorsiflexion    Ankle plantarflexion    Ankle inversion    Ankle eversion     PALPATION:   General  lumbar extensors tight                External Perineal Exam normal with descending perineal body and descending urethra                             Internal Pelvic Floor TTP obturator internus Left; muscle fasciculations and tight throughout the left side  Patient confirms identification and approves PT  to assess internal pelvic floor and treatment Yes No emotional/communication barriers or cognitive limitation. Patient is motivated to learn. Patient understands and agrees with treatment goals and plan. PT explains patient will be examined in  standing, sitting, and lying down to see how their muscles and joints work. When they are ready, they will be asked to remove their underwear so PT can examine their perineum. The patient is also given the option of providing their own chaperone as one is not provided in our facility. The patient also has the right and is explained the right to defer or refuse any part of the evaluation or treatment including the internal exam. With the patient's consent, PT will use one gloved finger to gently assess the muscles of the pelvic floor, seeing how well it contracts and relaxes and if there is muscle symmetry. After, the patient will get dressed and PT and patient will discuss exam findings and plan of care. PT and patient discuss plan of care, schedule, attendance policy and HEP activities.   PELVIC MMT:   MMT eval  Vaginal 3/5 x 10 reps; hold 17 sec but only 2/5 and was unsteady  Internal Anal Sphincter   External Anal Sphincter   Puborectalis   Diastasis Recti   (Blank rows = not tested)        TONE: high  PROLAPSE: Anterior and posterior wall - more from anterior wall  TODAY'S TREATMENT:                                                                                                                   DATE:  10/19/2022: Manual: Patient confirms identification and approved physical therapist to perform muscle strength and integrity assessment obturator internus  Cupping to lumbar region  Exercise: Diaphragmatic breathing with pelvic floor contraction on incline  Diaphragmatic breathing with pelvic floor contraction on incline butterfly stretch Diaphragmatic breathing with pelvic floor contraction CatCow Diaphragmatic breathing with pelvic floor contraction  happy baby  Diaphragmatic breathing with pelvic floor contraction childs pose  Self Home management  Toileting techniques Mositurizing   10/04/2022:EVAL    PATIENT EDUCATION:  Education details: educated on moisturizers and  toileting techniques  Person educated: Patient Education method: Explanation, Demonstration, Tactile cues, Verbal cues, and Handouts Education comprehension: verbalized understanding  HOME EXERCISE PROGRAM: Access Code: [email or web address] URL: [email or web address] Date: 10/19/2022 Prepared by: Dwana Curd  Exercises - Supine Diaphragmatic Breathing  - 3 x daily - 7 x weekly - 1 sets - 10 reps - Supine Butterfly Groin Stretch  - 1 x daily - 7 x weekly - 1 sets - 3 reps - 30 sec hold - Cat Cow to Child's Pose  - 1 x daily - 7 x weekly - 1 sets - 5 reps - 10 sec hold - Happy Baby with Pelvic Floor Lengthening  - 1 x daily - 7 x weekly - 1 sets - 3 reps - 30 hold  ASSESSMENT:  CLINICAL IMPRESSION: Patient returned after initial evaluation. Patient responded well to therapy today.  Today session focused on soft tissue release on the internal pelvic floor muscles as well as the lower back and stretching and relaxation of the pelvic floor muscles. Patient was able to show good coordination of the pelvic floor and breathing after informed not to push down when breathing in. With the CatCow exercise patient demonstrated lumbar tightness with the flexion position. Patient was educated on toileting techniques to help with pressure feeling she is having after using the restroom and also on performing the internal soft tissue at home. Patient was given a HEP to work on stretches at home. Patient would benefit from skilled therapy to continue to work on strengthening of pelvic floor muscles, hip and core for better QOL to help with pain during intercourse.  OBJECTIVE IMPAIRMENTS: decreased endurance, decreased knowledge of condition, decreased strength, increased muscle spasms, postural dysfunction, and pain.   ACTIVITY LIMITATIONS: bending, toileting, and exercising  PARTICIPATION LIMITATIONS: interpersonal relationship and community activity  PERSONAL FACTORS: 1 comorbidity: 2 c-sections   are also affecting patient's functional outcome.   REHAB POTENTIAL: Excellent  CLINICAL DECISION MAKING: Evolving/moderate complexity  EVALUATION COMPLEXITY: Moderate   GOALS: Goals reviewed with patient? Yes  SHORT TERM GOALS: Target date: 11/01/2022  Ind with initial HEP Baseline: Goal status: INITIAL   LONG TERM GOALS: Target date: 12/27/2022   Pt will be independent with advanced HEP to maintain improvements made throughout therapy  Baseline: doesn't know Goal status: INITIAL  2.  Pt will have 1/3 or lower score of Marinoff scale  Baseline: 3/3 Goal status: INITIAL  3.  Pt will report 10 or less on PFIQ due to improved symptoms from prolapse Baseline: 24 Goal status: INITIAL  4.  Pt will report at least 75% less pain with normal daily activities due to improved postural strength Baseline:  Goal status: INITIAL  5.  Pt will report 50% less severity of prolapse after toileting or intercourse Baseline:  Goal status: INITIAL   PLAN:  PT FREQUENCY: 1x/week  PT DURATION: 12 weeks  PLANNED INTERVENTIONS: Therapeutic exercises, Therapeutic activity, Neuromuscular re-education, Balance training, Gait training, Patient/Family education, Self Care, Joint mobilization, Dry Needling, Electrical stimulation, Cryotherapy, Moist heat, Taping, Traction, Biofeedback, Manual therapy, and Re-evaluation  PLAN FOR NEXT SESSION: f/u on internal soft tissue and toileting techniques. Work of pelvic floor isolation with functional activities; start core strengthening     Carrson Lightcap, Student-PT 10/19/2022  9:07 AM   I agree with the following treatment note after reviewing documentation. This session was performed under the supervision of a licensed clinician.  Russella Dar, PT 10/19/22 9:16 AM

## 2022-11-08 ENCOUNTER — Encounter: Payer: Medicaid Other | Admitting: Physical Therapy

## 2022-11-12 ENCOUNTER — Encounter: Payer: Self-pay | Admitting: Obstetrics and Gynecology

## 2022-11-19 ENCOUNTER — Encounter: Payer: Self-pay | Admitting: Family

## 2022-11-20 ENCOUNTER — Ambulatory Visit: Payer: Medicaid Other | Attending: Obstetrics and Gynecology | Admitting: Physical Therapy

## 2022-11-20 DIAGNOSIS — M6281 Muscle weakness (generalized): Secondary | ICD-10-CM | POA: Diagnosis not present

## 2022-11-20 DIAGNOSIS — M5459 Other low back pain: Secondary | ICD-10-CM | POA: Diagnosis not present

## 2022-11-20 DIAGNOSIS — R293 Abnormal posture: Secondary | ICD-10-CM | POA: Diagnosis not present

## 2022-11-20 NOTE — Therapy (Signed)
OUTPATIENT PHYSICAL THERAPY FEMALE PELVIC EVALUATION   Patient Name: [name] MRN: [id number] DOB:1986-04-20, 36 y.o., female Today's Date: 10/04/22    PT End of Session - 11/20/22 1624     Visit Number 3    Date for PT Re-Evaluation 12/27/22    Authorization Type [place] medicaid    PT Start Time 1620    PT Stop Time 1658    PT Time Calculation (min) 38 min    Activity Tolerance Patient tolerated treatment well    Behavior During Therapy [facility] for tasks assessed/performed               Past Medical History:  Diagnosis Date   Asthma    Iron deficiency anemia due to chronic blood loss 05/15/2018   Urticaria    Urticaria 04/21/2018   History of recurrent urticaria   Past Surgical History:  Procedure Laterality Date   CESAREAN SECTION  2009   CESAREAN SECTION  08/21/2012   Procedure: CESAREAN SECTION;  Surgeon: Daria Pastures, MD;  Location: [facility];  Service: Obstetrics;  Laterality: N/A;   Patient Active Problem List   Diagnosis Date Noted   Acute vaginitis 09/14/2022   Breast abscess 01/23/2022   Anosmia 01/23/2022   Rheumatoid arthritis ([place]) 01/23/2022   Preventative health care 01/23/2022   Paresthesia 09/19/2021   Elevated sed rate 05/19/2018   Iron deficiency anemia due to chronic blood loss 05/15/2018   Primary osteoarthritis of both knees 04/21/2018   Chondromalacia of patella 04/21/2018   History of asthma 04/21/2018   Morbid obesity ([place]) 08/09/2010   ASTHMA, CHILDHOOD 08/09/2010   BREAST TENDERNESS 08/09/2010    PCP:   Debbrah Alar, NP    REFERRING PROVIDER: Darliss Cheney, MD  REFERRING DIAG: R10.2 (ICD-10-CM) - Pelvic pain  THERAPY DIAG:  Muscle weakness (generalized)  Other low back pain  Abnormal posture  Rationale for Evaluation and Treatment Rehabilitation  ONSET DATE: over a year for pelvic pressure; back pain for 2 years  SUBJECTIVE:                                                                                                                                                                                            SUBJECTIVE STATEMENT: Pt is having less pressure since she has been using the stool during bowel movements. Fluid intake: Yes: water 16 oz/day    PAIN:  Are you having pain? No  EVALUATION PAIN:  Are you having pain? Yes NPRS scale: 3/10 (back); 6/10 (pelvic pressure) Pain location:  back  Pain type: radiating, numb and tingling down the left side buttock to foot Pain description:  intermittent   Aggravating factors: working out heavily Relieving factors: lying down or tilting back; pushing it back in  PRECAUTIONS: None  WEIGHT BEARING RESTRICTIONS: No  FALLS:  Has patient fallen in last 6 months? No  LIVING ENVIRONMENT: Lives with: lives with their family, lives with their spouse, and 2 children Lives in: House/apartment   OCCUPATION: nurse - school nurse  PLOF: Independent  PATIENT GOALS: not feel uncomfortable with daily activities  PERTINENT HISTORY:  2 c-section Sexual abuse: No  BOWEL MOVEMENT: Pain with bowel movement: No Type of bowel movement:Strain No, few months on semiglutide causes less frequent bowel movements   URINATION: Pain with urination: No Fully empty bladder: Yes: sometimes feels like it isn't but hard to tell Stream: Strong Urgency: No Frequency: normal Leakage:  no Pads: No  INTERCOURSE: Pain with intercourse: Initial Penetration and After Intercourse  Ability to have vaginal penetration:  Yes:   Climax: No Marinoff Scale: 3/3 and sometimes have to find the right position  PREGNANCY: Vaginal deliveries 0 Tearing  C-section deliveries 2 Currently pregnant No  PROLAPSE: Cystocele     OBJECTIVE:   DIAGNOSTIC FINDINGS:    PATIENT SURVEYS:    PFIQ-7 = 24  COGNITION: Overall cognitive status: Within functional limits for tasks assessed     SENSATION: Light touch: Appears intact Proprioception:   MUSCLE  LENGTH: Hamstrings: Right 90 deg; Left 90 deg+P in hip Thomas test:   LUMBAR SPECIAL TESTS:  Straight leg raise test: only after 90 deg some pain in buttock  FUNCTIONAL TESTS:  Standing no arch bil  GAIT:  Comments: WFL   POSTURE: rounded shoulders, increased lumbar lordosis, and anterior pelvic tilt  PELVIC ALIGNMENT: normal LUMBARAROM/PROM:  A/PROM A/PROM  eval  Flexion Normal +pain  Extension Normal pain relief  Right lateral flexion   Left lateral flexion   Right rotation   Left rotation    (Blank rows = not tested)  LOWER EXTREMITY ROM:  Passive ROM Right eval Left eval  Hip flexion [facility]   Hip extension    Hip abduction    Hip adduction    Hip internal rotation    Hip external rotation    Knee flexion    Knee extension    Ankle dorsiflexion    Ankle plantarflexion    Ankle inversion    Ankle eversion     (Blank rows = not tested)  LOWER EXTREMITY MMT:  MMT Right eval Left eval  Hip flexion    Hip extension    Hip abduction    Hip adduction    Hip internal rotation    Hip external rotation    Knee flexion    Knee extension    Ankle dorsiflexion    Ankle plantarflexion    Ankle inversion    Ankle eversion     PALPATION:   General  lumbar extensors tight                External Perineal Exam normal with descending perineal body and descending urethra                             Internal Pelvic Floor TTP obturator internus Left; muscle fasciculations and tight throughout the left side  Patient confirms identification and approves PT to assess internal pelvic floor and treatment Yes No emotional/communication barriers or cognitive limitation. Patient is motivated to learn. Patient understands and agrees with treatment goals and plan. PT  explains patient will be examined in standing, sitting, and lying down to see how their muscles and joints work. When they are ready, they will be asked to remove their underwear so PT can examine their  perineum. The patient is also given the option of providing their own chaperone as one is not provided in our facility. The patient also has the right and is explained the right to defer or refuse any part of the evaluation or treatment including the internal exam. With the patient's consent, PT will use one gloved finger to gently assess the muscles of the pelvic floor, seeing how well it contracts and relaxes and if there is muscle symmetry. After, the patient will get dressed and PT and patient will discuss exam findings and plan of care. PT and patient discuss plan of care, schedule, attendance policy and HEP activities.   PELVIC MMT:   MMT eval  Vaginal 3/5 x 10 reps; hold 17 sec but only 2/5 and was unsteady  Internal Anal Sphincter   External Anal Sphincter   Puborectalis   Diastasis Recti   (Blank rows = not tested)        TONE: high  PROLAPSE: Anterior and posterior wall - more from anterior wall  TODAY'S  TREATMENT:                                                                                                                   DATE:11/20/22   Exercise: Diaphragmatic breathing with pelvic floor contraction on incline - 3 ways using block Diaphragmatic breathing with pelvic floor contraction bent knee fall out - 10x Diaphragmatic breathing with  CatCow - 10x Quadruped UE reaching - 10x Modified dead bug TC/VC to engage core and keep ribcage down Hip flexor stretches Knee to chest - single with pillow under hips Bridges with kegel 8x   TREATMENT:                                                                                                                   DATE:  10/19/2022: Manual: Patient confirms identification and approved physical therapist to perform muscle strength and integrity assessment obturator internus  Cupping to lumbar region  Exercise: Diaphragmatic breathing with pelvic floor contraction on incline  Diaphragmatic breathing with pelvic floor  contraction on incline butterfly stretch Diaphragmatic breathing with pelvic floor contraction CatCow Diaphragmatic breathing with pelvic floor contraction  happy baby  Diaphragmatic breathing with pelvic floor contraction childs pose  Self Home management  Toileting techniques Mositurizing   10/04/2022:EVAL  PATIENT EDUCATION:  Education details: Access Code: [email or web address] Person educated: Patient Education method: Explanation, Demonstration, Tactile cues, Verbal cues, and Handouts Education comprehension: verbalized understanding  HOME EXERCISE PROGRAM: Access Code: [email or web address] URL: [email or web address] Date: 10/19/2022 Prepared by: Jari Favre  Exercises - Supine Diaphragmatic Breathing  - 3 x daily - 7 x weekly - 1 sets - 10 reps - Supine Butterfly Groin Stretch  - 1 x daily - 7 x weekly - 1 sets - 3 reps - 30 sec hold - Cat Cow to Child's Pose  - 1 x daily - 7 x weekly - 1 sets - 5 reps - 10 sec hold - Happy Baby with Pelvic Floor Lengthening  - 1 x daily - 7 x weekly - 1 sets - 3 reps - 30 hold  ASSESSMENT:  CLINICAL IMPRESSION: Patient is only feeling the bulging a small amount since altering the toileting techniques and using a stool  Pt was able to do well with adding basic core and pelvic floor strengthening today.  Pt needed cues to do correct breathing coordination and to keep core activated  throughout the exercise. Pt is recommended to continue to address posture and strength impairments for improved function without increased risk of prolapse.  OBJECTIVE IMPAIRMENTS: decreased endurance, decreased knowledge of condition, decreased strength, increased muscle spasms, postural dysfunction, and pain.   ACTIVITY LIMITATIONS: bending, toileting, and exercising  PARTICIPATION LIMITATIONS: interpersonal relationship and community activity  PERSONAL FACTORS: 1 comorbidity: 2 c-sections  are also affecting patient's functional outcome.   REHAB  POTENTIAL: Excellent  CLINICAL DECISION MAKING: Evolving/moderate complexity  EVALUATION COMPLEXITY: Moderate   GOALS: Goals reviewed with patient? Yes  SHORT TERM GOALS: Target date: 11/01/2022  Ind with initial HEP Baseline: Goal status: MET   LONG TERM GOALS: Target date: 12/27/2022  Updated 11/20/22  Pt will be independent with advanced HEP to maintain improvements made throughout therapy  Baseline: doesn't know Goal status: IN PROGRESS  2.  Pt will have 1/3 or lower score of Marinoff scale  Baseline: 3/3 Goal status: IN PROGRESS  3.  Pt will report 10 or less on PFIQ due to improved symptoms from prolapse Baseline: 24 Goal status: IN PROGRESS  4.  Pt will report at least 75% less pain with normal daily activities due to improved postural strength Baseline:  Goal status: IN PROGRESS  5.  Pt will report 50% less severity of prolapse after toileting or intercourse Baseline: better only feeling it a small amount Goal status: IN PROGRESS   PLAN:  PT FREQUENCY: 1x/week  PT DURATION: 12 weeks  PLANNED INTERVENTIONS: Therapeutic exercises, Therapeutic activity, Neuromuscular re-education, Balance training, Gait training, Patient/Family education, Self Care, Joint mobilization, Dry Needling, Electrical stimulation, Cryotherapy, Moist heat, Taping, Traction, Biofeedback, Manual therapy, and Re-evaluation  PLAN FOR NEXT SESSION: continue pelvic floor isolation with functional activities and core strengthening     Gustavus Bryant, PT 11/20/22 4:24 PM

## 2022-11-24 ENCOUNTER — Encounter: Payer: Self-pay | Admitting: Family

## 2022-12-28 ENCOUNTER — Encounter: Payer: Self-pay | Admitting: Obstetrics and Gynecology

## 2023-02-01 ENCOUNTER — Ambulatory Visit: Payer: Medicaid Other | Admitting: Obstetrics and Gynecology

## 2023-02-01 ENCOUNTER — Encounter: Payer: Self-pay | Admitting: Obstetrics and Gynecology

## 2023-02-01 VITALS — BP 115/58 | HR 95 | Ht <= 58 in | Wt 169.0 lb

## 2023-02-01 DIAGNOSIS — N921 Excessive and frequent menstruation with irregular cycle: Secondary | ICD-10-CM

## 2023-02-01 DIAGNOSIS — Z30432 Encounter for removal of intrauterine contraceptive device: Secondary | ICD-10-CM | POA: Diagnosis not present

## 2023-02-01 NOTE — Patient Instructions (Addendum)
We removed your IUD  We are checking a few labs  We will see how your bleeding is doing in 3-4 months. If you decide before then you would like to try a different form of birth control/manage your periods, let me know

## 2023-02-01 NOTE — Progress Notes (Signed)
    GYNECOLOGY VISIT  Patient name: [name] MRN [id number]  Date of birth: [birth year] Chief Complaint:   No chief complaint on file.   History:  [name] is a 37 y.o. G2P2002 being seen today for BTB w/ IUD in place.  Bleeding is at least weekly. Some days are lighter than others. Bleeding can be bright or dark. Interested in removal and wanting to give her body a break. Has had IUD in for at least 14 years total and doesn't know what her menses may do. Also notes having new bruising she noticed last May. Typically on thighs. No bleeding from gums, may be a spot of blood with blowing nose Mom has hx of blood clots  Did not need blood tranfusion with delivery   Past Medical History:  Diagnosis Date  . Asthma   . Iron deficiency anemia due to chronic blood loss 05/15/2018  . Urticaria   . Urticaria 04/21/2018   History of recurrent urticaria    Past Surgical History:  Procedure Laterality Date  . CESAREAN SECTION  2009  . CESAREAN SECTION  08/21/2012   Procedure: CESAREAN SECTION;  Surgeon: Daria Pastures, MD;  Location: [facility];  Service: Obstetrics;  Laterality: N/A;    The following portions of the patient's history were reviewed and updated as appropriate: allergies, current medications, past family history, past medical history, past social history, past surgical history and problem list.   Health Maintenance:   Last pap ***. Results were: {Pap findings:25134}. H/O abnormal pap: {yes/yes***/no:23866} Last mammogram: ***. Results were: {normal, abnormal, n/a:23837}. Family h/o breast cancer: {yes***/no:23838}   Review of Systems:  {Ros - complete:30496} Comprehensive review of systems was otherwise negative.   Objective:  Physical Exam There were no vitals taken for this visit.   Physical Exam   Labs and Imaging No results found.     Assessment & Plan:   There are no diagnoses linked to this encounter.   *** Routine preventative health maintenance  measures emphasized.  Darliss Cheney, MD [facility]

## 2023-02-01 NOTE — Progress Notes (Unsigned)
Patient still having abnormal bleeding with IUD.

## 2023-02-03 LAB — HEMOGLOBIN A1C
Est. average glucose Bld gHb Est-mCnc: 103 mg/dL
Hgb A1c MFr Bld: 5.2 % (ref 4.8–5.6)

## 2023-02-03 LAB — VON WILLEBRAND PANEL
Factor VIII Activity: 133 % (ref 56–140)
Von Willebrand Ag: 117 % (ref 50–200)
Von Willebrand Factor: 65 % (ref 50–200)

## 2023-02-03 LAB — COAG STUDIES INTERP REPORT

## 2023-02-03 LAB — TSH RFX ON ABNORMAL TO FREE T4: TSH: 0.96 u[IU]/mL (ref 0.450–4.500)

## 2023-02-03 LAB — PROLACTIN: Prolactin: 7.3 ng/mL (ref 4.8–33.4)

## 2023-04-10 ENCOUNTER — Encounter: Payer: Self-pay | Admitting: Obstetrics and Gynecology

## 2023-04-10 ENCOUNTER — Other Ambulatory Visit: Payer: Self-pay

## 2023-04-10 MED ORDER — METRONIDAZOLE 500 MG PO TABS: 500.0000 mg | ORAL_TABLET | Freq: Two times a day (BID) | ORAL | 0 refills | Status: DC

## 2023-04-15 ENCOUNTER — Telehealth (INDEPENDENT_AMBULATORY_CARE_PROVIDER_SITE_OTHER): Payer: Medicaid Other | Admitting: Obstetrics and Gynecology

## 2023-04-15 DIAGNOSIS — N921 Excessive and frequent menstruation with irregular cycle: Secondary | ICD-10-CM

## 2023-04-15 NOTE — Progress Notes (Signed)
    GYNECOLOGY VIRTUAL VISIT ENCOUNTER NOTE  Provider location: [facility] {Blank single:[id number]::"[facility]","[facility]","[facility]"}   Patient location: Home  I connected with Jaclynn Major on 04/15/23 at  8:35 AM EDT by MyChart Video Encounter and verified that I am speaking with the correct person using two identifiers.   I discussed the limitations, risks, security and privacy concerns of performing an evaluation and management service virtually and the availability of in person appointments. I also discussed with the patient that there may be a patient responsible charge related to this service. The patient expressed understanding and agreed to proceed.   History:  [name] is a 37 y.o. G23P2002 female being evaluated today for ***. She denies any abnormal vaginal discharge, bleeding, pelvic pain or other concerns.    after IUD removed, bleeding for a few day after. Has maybe had another menses isnce. No extra bleeding like before  \nuvaring didnt' work doesn't watn depo Always used IUD   Past Medical History:  Diagnosis Date   Asthma    Iron deficiency anemia due to chronic blood loss 05/15/2018   Urticaria    Urticaria 04/21/2018   History of recurrent urticaria   Past Surgical History:  Procedure Laterality Date   CESAREAN SECTION  2009   CESAREAN SECTION  08/21/2012   Procedure: CESAREAN SECTION;  Surgeon: Loney Laurence, MD;  Location: [facility];  Service: Obstetrics;  Laterality: N/A;   The following portions of the patient's history were reviewed and updated as appropriate: allergies, current medications, past family history, past medical history, past social history, past surgical history and problem list.   Health Maintenance:  Normal pap and negative HRHPV on ***.  Normal mammogram on ***.   Review of Systems:  Pertinent items noted in HPI and remainder  of comprehensive ROS otherwise negative.  Physical Exam:   General:  Alert, oriented and cooperative. Patient appears to be in no acute distress.  Mental Status: Normal mood and affect. Normal behavior. Normal judgment and thought content.   Respiratory: Normal respiratory effort, no problems with respiration noted  Rest of physical exam deferred due to type of encounter  Labs and Imaging No results found for this or any previous visit (from the past 336 hour(s)). No results found.     Assessment and Plan:     There are no diagnoses linked to this encounter.      I discussed the assessment and treatment plan with the patient. The patient was provided an opportunity to ask questions and all were answered. The patient agreed with the plan and demonstrated an understanding of the instructions.   The patient was advised to call back or seek an in-person evaluation/go to the ED if the symptoms worsen or if the condition fails to improve as anticipated.  I provided *** minutes of face-to-face time during this encounter.   Lorriane Shire, MD [facility] [employer], [facility]

## 2023-05-08 ENCOUNTER — Encounter: Payer: Medicaid Other | Admitting: Family

## 2023-05-29 ENCOUNTER — Encounter: Payer: Medicaid Other | Admitting: Family

## 2023-06-11 ENCOUNTER — Encounter: Payer: Self-pay | Admitting: Obstetrics and Gynecology

## 2023-06-17 ENCOUNTER — Ambulatory Visit (INDEPENDENT_AMBULATORY_CARE_PROVIDER_SITE_OTHER): Payer: Medicaid Other | Admitting: Family

## 2023-06-17 ENCOUNTER — Other Ambulatory Visit (HOSPITAL_COMMUNITY)
Admission: RE | Admit: 2023-06-17 | Discharge: 2023-06-17 | Disposition: A | Discharge status: Home / Self Care | Payer: Medicaid Other | Source: Ambulatory Visit | Attending: Family | Admitting: Family

## 2023-06-17 ENCOUNTER — Encounter: Payer: Self-pay | Admitting: Family

## 2023-06-17 VITALS — BP 113/72 | HR 87 | Temp 98.5°F | Resp 20 | Ht <= 58 in | Wt 167.0 lb

## 2023-06-17 DIAGNOSIS — Z3A01 Less than 8 weeks gestation of pregnancy: Secondary | ICD-10-CM | POA: Diagnosis not present

## 2023-06-17 DIAGNOSIS — N941 Unspecified dyspareunia: Secondary | ICD-10-CM | POA: Insufficient documentation

## 2023-06-17 DIAGNOSIS — Z0000 Encounter for general adult medical examination without abnormal findings: Secondary | ICD-10-CM | POA: Diagnosis not present

## 2023-06-17 DIAGNOSIS — Z111 Encounter for screening for respiratory tuberculosis: Secondary | ICD-10-CM | POA: Diagnosis not present

## 2023-06-17 LAB — LIPID PANEL
Cholesterol: 160 mg/dL (ref 0–200)
HDL: 48.7 mg/dL (ref >39.00)
LDL Cholesterol: 96 mg/dL (ref 0–99)
NonHDL: 111.26
Total CHOL/HDL Ratio: 3
Triglycerides: 75 mg/dL (ref 0.0–149.0)
VLDL: 15 mg/dL (ref 0.0–40.0)

## 2023-06-17 NOTE — Progress Notes (Signed)
Subjective:     Patient ID: [name], female    DOB: [birth year], 37 y.o.   MRN: [id number]  Chief Complaint  Patient presents with   Annual Exam    HPI  Discussed the use of AI scribe software for clinical note transcription with the patient, who gave verbal consent to proceed.  History of Present Illness   The patient, with a history of rheumatoid arthritis, presents for her annual physical.  She recently learned that she is pregnant.  LMP was around 04/27/23. She reports this pregnancy was not surprising and is a welcome event. She has an appointment scheduled with her OBGYN in the near future. She is not currently taking prenatal vitamins but plans to start soon. She has been experiencing some nausea and decreased appetite, which she attributes to the pregnancy. She also reports some discomfort during intercourse during the past week, which she is unsure if it is related to the pregnancy or a possible BV infection. She has stopped taking Ozempic since discovering the pregnancy and had her Mirena IUD removed in February due to heavy bleeding. She has two older children, aged 57 and 58.     Immunizations: Tdap 2017 Diet: fair Exercise: on and off Pap Smear: 2/23 Vision: due Dental: due      Health Maintenance Due  Topic Date Due   COVID-19 Vaccine (3 - Pfizer risk series) 02/08/2022    Past Medical History:  Diagnosis Date   Asthma    Iron deficiency anemia due to chronic blood loss 05/15/2018   Urticaria    Urticaria 04/21/2018   History of recurrent urticaria    Past Surgical History:  Procedure Laterality Date   CESAREAN SECTION  2009   CESAREAN SECTION  08/21/2012   Procedure: CESAREAN SECTION;  Surgeon: Loney Laurence, MD;  Location: [facility];  Service: Obstetrics;  Laterality: N/A;    Family History  Problem Relation Age of Onset   Fibromyalgia Mother    Deep vein thrombosis Mother        recurrent dvt   Cancer Father        unsure   Migraines  Sister    Healthy Daughter    Healthy Son    Eczema Maternal Uncle    Other Neg Hx     Social History   Socioeconomic History   Marital status: Married    Spouse name: Not on file   Number of children: Not on file   Years of education: Not on file   Highest education level: Not on file  Occupational History   Occupation: nurse    Employer: [employer]  Tobacco Use   Smoking status: Never   Smokeless tobacco: Never  Vaping Use   Vaping Use: Never used  Substance and Sexual Activity   Alcohol use: Yes    Comment: socially 2 per week   Drug use: No   Sexual activity: Yes    Partners: Male  Other Topics Concern   Not on file  Social History Narrative   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     Social Determinants of Health   Financial Resource Strain: Low Risk  (03/06/2018)   Overall Financial Resource Strain (CARDIA)    Difficulty of Paying Living Expenses: Not hard at all  Food Insecurity:  No Food Insecurity (03/06/2018)   Hunger Vital Sign    Worried About Running Out of Food in the Last Year: Never true    Ran Out of Food in the Last Year: Never true  Transportation Needs: No Transportation Needs (03/06/2018)   PRAPARE - Administrator, Civil Service (Medical): No    Lack of Transportation (Non-Medical): No  Physical Activity: Unknown (03/06/2018)   Exercise Vital Sign    Days of Exercise per Week: 0 days    Minutes of Exercise per Session: Not on file  Stress: No Stress Concern Present (03/06/2018)   [employer] of Occupational Health - Occupational Stress Questionnaire    Feeling of Stress : Only a little  Social Connections: Unknown (03/06/2018)   Social Connection and Isolation Panel [NHANES]    Frequency of Communication with Friends and Family: More than three times a week    Frequency of Social  Gatherings with Friends and Family: Never    Attends Religious Services: Never    Diplomatic Services operational officer: Not on file    Attends Banker Meetings: Never    Marital Status: Not on file  Intimate Partner Violence: Not on file    Outpatient Medications Prior to Visit  Medication Sig Dispense Refill   VENTOLIN HFA 108 (90 Base) MCG/ACT inhaler INHALE 2 PUFFS INTO THE LUNGS EVERY 6 HOURS AS NEEDED FOR WHEEZING OR SHORTNESS OF BREATH 18 g 0   cephALEXin (KEFLEX) 500 MG capsule Take 1 capsule (500 mg total) by mouth 3 (three) times daily. 21 capsule 0   gabapentin (NEURONTIN) 100 MG capsule Take 1 capsule (100 mg total) by mouth 3 (three) times daily. 90 capsule 3   meloxicam (MOBIC) 7.5 MG tablet TAKE 1 TABLET(7.5 MG) BY MOUTH DAILY 14 tablet 0   metroNIDAZOLE (FLAGYL) 500 MG tablet Take 1 tablet (500 mg total) by mouth 2 (two) times daily. 14 tablet 0   SEMAGLUTIDE-WEIGHT MANAGEMENT [place] Inject into the skin.     levonorgestrel (MIRENA, 52 MG,) 20 MCG/24HR IUD 1 Intra Uterine Device (1 each total) by Intrauterine route once for 1 dose. 1 each 0   No facility-administered medications prior to visit.    No Known Allergies  Review of Systems  Constitutional:  Negative for weight loss.  HENT:  Negative for congestion and hearing loss.   Eyes:  Negative for blurred vision.  Respiratory:  Negative for cough.   Cardiovascular:  Negative for leg swelling.  Gastrointestinal:  Negative for constipation and diarrhea.  Genitourinary:  Negative for dysuria and frequency.  Musculoskeletal:  Negative for joint pain and myalgias.  Skin:  Negative for rash.  Neurological:  Negative for headaches.  Psychiatric/Behavioral:         Denies depression/anxiety       Objective:    Physical Exam   BP 113/72 (BP Location: Right Arm, Patient Position: Sitting, Cuff Size: Normal)   Pulse 87   Temp 98.5 F (36.9 C) (Oral)   Resp 20   Ht 4' 9.5" (1.461 m)   Wt 167 lb (75.8  kg)   SpO2 100%   BMI 35.51 kg/m  Wt Readings from Last 3 Encounters:  06/17/23 167 lb (75.8 kg)  02/01/23 169 lb (76.7 kg)  10/01/22 180 lb (81.6 kg)   Physical Exam  Constitutional: She is oriented to person, place, and time. She appears well-developed and well-nourished. No distress.  HENT:  Head: Normocephalic and atraumatic.  Right Ear: Tympanic membrane  and ear canal normal.  Left Ear: Tympanic membrane and ear canal normal.  Mouth/Throat: Oropharynx is clear and moist.  Eyes: Pupils are equal, round, and reactive to light. No scleral icterus.  Neck: Normal range of motion. No thyromegaly present.  Cardiovascular: Normal rate and regular rhythm.   No murmur heard. Pulmonary/Chest: Effort normal and breath sounds normal. No respiratory distress. He has no wheezes. She has no rales. She exhibits no tenderness.  Abdominal: Soft. Bowel sounds are normal. She exhibits no distension and no mass. There is no tenderness. There is no rebound and no guarding.  Musculoskeletal: She exhibits no edema.  Lymphadenopathy:    She has no cervical adenopathy.  Neurological: She is alert and oriented to person, place, and time. She has normal patellar reflexes. She exhibits normal muscle tone. Coordination normal.  Skin: Skin is warm and dry.  Psychiatric: She has a normal mood and affect. Her behavior is normal. Judgment and thought content normal.  Breast/Pelvic: deferred         Assessment & Plan:       Assessment & Plan:   Problem List Items Addressed This Visit       Unprioritized   Preventative health care - Primary    Immunizations reviewed and up to date, discussed Tdap booster at end of pregnancy due to pertussis. -Plan for Tdap booster later in pregnancy with OB/GYN  Clinical Placement Immunization Requirements: Needs updated titers for MMR, Hep B, and Varicella for clinical placement. -Order MMR, Hep B, and Varicella titers. -Order TB gold blood screening.       Relevant Orders   Measles/Mumps/Rubella Immunity   Hepatitis B surface antibody,quantitative   Varicella-Zoster Virus Antibody (Immunity Screen), ACIF ,Serum   Lipid panel (Completed)   Less than [redacted] weeks gestation of pregnancy    Newly pregnant, last menstrual period on May 18th, 2024. No prenatal vitamin use yet. -Start over-the-counter prenatal vitamins. -OBGYN appointment scheduled for June 28th, 2024.      Dyspareunia in female    Pt is requesting swab for BV.  Self wet prep obtained and will be sent for BV/Yeast.  Suspect most likely due to early pregnancy.       Relevant Orders   Cervicovaginal ancillary only( [place])   Other Visit Diagnoses     Tuberculosis screening       Relevant Orders   QuantiFERON-TB Gold Plus       I have discontinued Syrita A. Bowen's levonorgestrel, gabapentin, meloxicam, cephALEXin, SEMAGLUTIDE-WEIGHT MANAGEMENT , and metroNIDAZOLE. I am also having her maintain her Ventolin HFA.  No orders of the defined types were placed in this encounter.

## 2023-06-17 NOTE — Assessment & Plan Note (Signed)
Pt is requesting swab for BV.  Self wet prep obtained and will be sent for BV/Yeast.  Suspect most likely due to early pregnancy.

## 2023-06-17 NOTE — Assessment & Plan Note (Signed)
Immunizations reviewed and up to date, discussed Tdap booster at end of pregnancy due to pertussis. -Plan for Tdap booster later in pregnancy with OB/GYN  Clinical Placement Immunization Requirements: Needs updated titers for MMR, Hep B, and Varicella for clinical placement. -Order MMR, Hep B, and Varicella titers. -Order TB gold blood screening.

## 2023-06-17 NOTE — Assessment & Plan Note (Signed)
Newly pregnant, last menstrual period on May 18th, 2024. No prenatal vitamin use yet. -Start over-the-counter prenatal vitamins. -OBGYN appointment scheduled for June 28th, 2024.

## 2023-06-17 NOTE — Patient Instructions (Signed)
VISIT SUMMARY:hk  During our visit, we discussed your recent discovery of being pregnant and the associated symptoms you've been experiencing. We also talked about your immunization status, the need for updated titers for your clinical placement, a possible bacterial vaginosis (BV) infection, and your hyperlipidemia. We also touched on general health maintenance.  YOUR PLAN:  -EARLY PREGNANCY: You are in the early stages of your pregnancy. It's important to start taking over-the-counter prenatal vitamins to support your health and the development of your baby. You have an appointment with your OBGYN on June 28th, 2024.  -IMMUNIZATIONS: Your immunizations are up to date. We discussed getting a Tdap booster later in your pregnancy to protect against pertussis, a respiratory infection that can be serious for newborns.  -CLINICAL PLACEMENT IMMUNIZATION REQUIREMENTS: For your clinical placement, you need updated titers for MMR, Hep B, and Varicella. These are blood tests that check your immunity to these diseases. We will also order a TB blood screening.  -POSSIBLE BACTERIAL VAGINOSIS (BV): You've reported discomfort during intercourse, which could be signs of bacterial vaginosis, an infection of the vagina. We will perform a self-swab for BV and yeast to confirm.  -GENERAL HEALTH MAINTENANCE: For your overall health, it's important to maintain regular exercise and a healthy diet. Regular vision and dental check-ups are also recommended. We also updated your family history details regarding your father's gastrointestinal cancer.  INSTRUCTIONS:  Please start taking over-the-counter prenatal vitamins as soon as possible. We will order the necessary blood tests for your clinical placement and to check your cholesterol levels. You will also need to perform a self-swab for BV and yeast. Continue to maintain regular exercise and a healthy diet, and make sure to schedule regular vision and dental check-ups.

## 2023-06-18 LAB — CERVICOVAGINAL ANCILLARY ONLY
Bacterial Vaginitis (gardnerella): NEGATIVE
Candida Glabrata: NEGATIVE
Candida Vaginitis: NEGATIVE
Comment: NEGATIVE
Comment: NEGATIVE
Comment: NEGATIVE

## 2023-06-19 LAB — QUANTIFERON-TB GOLD PLUS
Mitogen-NIL: >10 IU/mL
NIL: 0.03 IU/mL
QuantiFERON-TB Gold Plus: NEGATIVE
TB1-NIL: 0.01 IU/mL
TB2-NIL: 0.04 IU/mL

## 2023-06-20 LAB — MEASLES/MUMPS/RUBELLA IMMUNITY
Mumps IgG: 75.4 AU/mL
Rubella: 3.68 Index
Rubeola IgG: >300 AU/mL

## 2023-06-20 LAB — VARICELLA-ZOSTER VIRUS AB(IMMUNITY SCREEN),ACIF,SERUM: VARICELLA ZOSTER VIRUS AB (IMMUNITY SCR),ACIF SERUM: >=1:4 {titer}

## 2023-06-20 LAB — HEPATITIS B SURFACE ANTIBODY, QUANTITATIVE: Hep B S AB Quant (Post): 51 m[IU]/mL (ref >=10)

## 2023-07-08 ENCOUNTER — Ambulatory Visit (HOSPITAL_BASED_OUTPATIENT_CLINIC_OR_DEPARTMENT_OTHER)
Admission: RE | Admit: 2023-07-08 | Discharge: 2023-07-08 | Disposition: A | Discharge status: Home / Self Care | Payer: Medicaid Other | Source: Ambulatory Visit | Attending: Obstetrics and Gynecology | Admitting: Obstetrics and Gynecology

## 2023-07-08 ENCOUNTER — Other Ambulatory Visit (HOSPITAL_COMMUNITY)
Admission: RE | Admit: 2023-07-08 | Discharge: 2023-07-08 | Disposition: A | Discharge status: Home / Self Care | Payer: Medicaid Other | Source: Ambulatory Visit | Attending: Obstetrics and Gynecology | Admitting: Obstetrics and Gynecology

## 2023-07-08 ENCOUNTER — Encounter: Payer: Self-pay | Admitting: Obstetrics and Gynecology

## 2023-07-08 ENCOUNTER — Other Ambulatory Visit: Payer: Self-pay | Admitting: Obstetrics and Gynecology

## 2023-07-08 ENCOUNTER — Ambulatory Visit (INDEPENDENT_AMBULATORY_CARE_PROVIDER_SITE_OTHER): Payer: Medicaid Other | Admitting: Obstetrics and Gynecology

## 2023-07-08 ENCOUNTER — Encounter: Payer: Self-pay | Admitting: Family

## 2023-07-08 VITALS — Wt 169.0 lb

## 2023-07-08 DIAGNOSIS — O09529 Supervision of elderly multigravida, unspecified trimester: Secondary | ICD-10-CM | POA: Insufficient documentation

## 2023-07-08 DIAGNOSIS — O09899 Supervision of other high risk pregnancies, unspecified trimester: Secondary | ICD-10-CM | POA: Insufficient documentation

## 2023-07-08 DIAGNOSIS — Z3A10 10 weeks gestation of pregnancy: Secondary | ICD-10-CM

## 2023-07-08 DIAGNOSIS — Z1339 Encounter for screening examination for other mental health and behavioral disorders: Secondary | ICD-10-CM | POA: Diagnosis not present

## 2023-07-08 DIAGNOSIS — Z98891 History of uterine scar from previous surgery: Secondary | ICD-10-CM

## 2023-07-08 DIAGNOSIS — Z6834 Body mass index (BMI) 34.0-34.9, adult: Secondary | ICD-10-CM

## 2023-07-08 DIAGNOSIS — O3481 Maternal care for other abnormalities of pelvic organs, first trimester: Secondary | ICD-10-CM | POA: Diagnosis not present

## 2023-07-08 DIAGNOSIS — O09521 Supervision of elderly multigravida, first trimester: Secondary | ICD-10-CM

## 2023-07-08 DIAGNOSIS — O09511 Supervision of elderly primigravida, first trimester: Secondary | ICD-10-CM | POA: Diagnosis not present

## 2023-07-08 DIAGNOSIS — O30009 Twin pregnancy, unspecified number of placenta and unspecified number of amniotic sacs, unspecified trimester: Secondary | ICD-10-CM | POA: Diagnosis not present

## 2023-07-08 NOTE — Progress Notes (Signed)
History:   [name] is a 37 y.o. G3P2002 at [redacted]w[redacted]d by LMP being seen today for her first obstetrical visit.   Patient does intend to breast feed.   Pregnancy history fully reviewed. Obstetrical history is significant for 2 FT deliveries by c-section. She attempted TOLAC in 2013 but was unsuccessful.   Patient reports no complaints.      HISTORY: OB History  Gravida Para Term Preterm AB Living  3 2 2  0 0 2  SAB IAB Ectopic Multiple Live Births  0 0 0 0 2    # Outcome Date GA Lbr Len/2nd Weight Sex Type Anes PTL Lv  3 Current           2 Term 08/21/12 [redacted]w[redacted]d  8 lb 5.9 oz (3.795 kg) F CS-LTranv EPI  LIV     Birth Comments: No problems at birth     Name: HARPER,GIRL Satrina     Apgar1: 8    1 Term 2009    M CS-Unspec  N LIV     Birth Comments: induction, distress after arom     Lab Results  Component Value Date   DIAGPAP  01/23/2022    - Negative for Intraepithelial Lesions or Malignancy (NILM)   DIAGPAP - Benign reactive/reparative changes 01/23/2022   HPVHIGH Negative 01/23/2022     Past Medical History:  Diagnosis Date   Asthma    Iron deficiency anemia due to chronic blood loss 05/15/2018   Urticaria    Urticaria 04/21/2018   History of recurrent urticaria   Past Surgical History:  Procedure Laterality Date   CESAREAN SECTION  2009   CESAREAN SECTION  08/21/2012   Procedure: CESAREAN SECTION;  Surgeon: Loney Laurence, MD;  Location: [facility];  Service: Obstetrics;  Laterality: N/A;   Family History  Problem Relation Age of Onset   Fibromyalgia Mother    Deep vein thrombosis Mother        recurrent dvt   Cancer Father        unsure   Migraines Sister    Healthy Daughter    Healthy Son    Eczema Maternal Uncle    Other Neg Hx    Social History   Tobacco Use   Smoking status: Never   Smokeless tobacco: Never  Vaping Use   Vaping status: Never Used  Substance Use Topics   Alcohol use: Yes    Comment: socially 2 per week   Drug use: No   No  Known Allergies Current Outpatient Medications on File Prior to Visit  Medication Sig Dispense Refill   Prenatal Vit-Fe Fumarate-FA (MULTIVITAMIN-PRENATAL) 27-0.8 MG TABS tablet Take 1 tablet by mouth daily at 12 noon.     VENTOLIN HFA 108 (90 Base) MCG/ACT inhaler INHALE 2 PUFFS INTO THE LUNGS EVERY 6 HOURS AS NEEDED FOR WHEEZING OR SHORTNESS OF BREATH 18 g 0   No current facility-administered medications on file prior to visit.    Review of Systems Pertinent items noted in HPI and remainder of comprehensive ROS otherwise negative.  Physical Exam:   Vitals:   07/08/23 0933  Weight: 169 lb (76.7 kg)   Fetal Heart Rate (bpm): 161  General: well-developed, well-nourished female in no acute distress  Breasts:  normal appearance, no masses or tenderness bilaterally  Skin: normal coloration and turgor, no rashes  Neurologic: oriented, normal, negative, normal mood  Extremities: normal strength, tone, and muscle mass, ROM of all joints is normal  HEENT PERRLA, extraocular movement  intact and sclera clear, anicteric  Neck supple and no masses  Cardiovascular: regular rate and rhythm  Respiratory:  no respiratory distress, normal breath sounds  Abdomen: soft, non-tender; bowel sounds normal; no masses,  no organomegaly    Assessment:    Pregnancy: [id number] Patient Active Problem List   Diagnosis Date Noted   Supervision of other high risk pregnancy, antepartum 07/08/2023   History of C-section 07/08/2023   Anosmia 01/23/2022   Rheumatoid arthritis (HCC) 01/23/2022   Primary osteoarthritis of both knees 04/21/2018   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 BMI 34.0-34.9,adult  2. History of C-section Reviewed her birth history. Reviewed option for TOLAC. She is undecided but will consider.   3. Supervision of other high risk pregnancy, antepartum Initial labs ordered. Discussed ACURE4MOMs. Pt  considering.  Continue prenatal vitamins. Problem list reviewed and updated. Genetic Screening discussed, NIPS: undecided.  Ultrasound discussed; fetal anatomic survey: ordered. Anticipatory guidance about prenatal visits given including labs, ultrasounds, and testing. Discussed usage of Babyscripts and virtual visits  4. Morbid obesity (HCC)    The nature of [facility] with multiple MDs and Advanced Practice Providers was explained to patient; also emphasized that residents, students are part of our team. Routine obstetric precautions reviewed. Encouraged to seek out care at office or emergency room [facility] preferred) for urgent and/or emergent concerns. Return in about 4 weeks (around 08/05/2023) for OB VISIT, MD or APP.    Milas Hock, MD, FACOG Obstetrician & Gynecologist, [facility] for [facility], [facility]

## 2023-07-08 NOTE — Addendum Note (Signed)
Addended by: Milas Hock A on: 07/08/2023 10:16 AM   Modules accepted: Orders

## 2023-08-05 ENCOUNTER — Ambulatory Visit (INDEPENDENT_AMBULATORY_CARE_PROVIDER_SITE_OTHER): Payer: Medicaid Other | Admitting: Obstetrics and Gynecology

## 2023-08-05 ENCOUNTER — Encounter: Payer: Self-pay | Admitting: Obstetrics and Gynecology

## 2023-08-05 VITALS — BP 115/51 | HR 95 | Wt 182.2 lb

## 2023-08-05 DIAGNOSIS — J45909 Unspecified asthma, uncomplicated: Secondary | ICD-10-CM

## 2023-08-05 DIAGNOSIS — O09529 Supervision of elderly multigravida, unspecified trimester: Secondary | ICD-10-CM

## 2023-08-05 DIAGNOSIS — Z3A14 14 weeks gestation of pregnancy: Secondary | ICD-10-CM

## 2023-08-05 DIAGNOSIS — R002 Palpitations: Secondary | ICD-10-CM

## 2023-08-05 DIAGNOSIS — Z6834 Body mass index (BMI) 34.0-34.9, adult: Secondary | ICD-10-CM

## 2023-08-05 DIAGNOSIS — Z98891 History of uterine scar from previous surgery: Secondary | ICD-10-CM

## 2023-08-05 DIAGNOSIS — O09899 Supervision of other high risk pregnancies, unspecified trimester: Secondary | ICD-10-CM

## 2023-08-05 MED ORDER — ASPIRIN 81 MG PO TBEC: 81.0000 mg | DELAYED_RELEASE_TABLET | Freq: Every day | ORAL | 2 refills | Status: DC

## 2023-08-05 NOTE — Patient Instructions (Signed)
Try: - Magnesium supplement (400-500mg  daily) - Compression socks - Hydration (minimum 2L water daily) - Caffeine

## 2023-08-05 NOTE — Progress Notes (Signed)
Declines Panorama and Doula program. [name], CMA

## 2023-08-05 NOTE — Progress Notes (Signed)
   PRENATAL VISIT NOTE  Subjective:  [name] is a 37 y.o. G3P2002 at [redacted]w[redacted]d being seen today for ongoing prenatal care.  She is currently monitored for the following issues for this high-risk pregnancy and has Morbid obesity (HCC); Extrinsic asthma; Primary osteoarthritis of both knees; Chondromalacia of patella; Anosmia; Rheumatoid arthritis (HCC); Supervision of other high risk pregnancy, antepartum; History of C-section; and AMA (advanced maternal age) multigravida 35+ on their problem list.  Patient reports  headache, palpitations, leg pain . Leg pain is bilateral after sitting for long periods. Headache is almost daily in the mornings - has cut back on caffeine.  Contractions: Not present. Vag. Bleeding: None.  Movement: Absent. Denies leaking of fluid.   The following portions of the patient's history were reviewed and updated as appropriate: allergies, current medications, past family history, past medical history, past social history, past surgical history and problem list.   Objective:   Vitals:   08/05/23 0946  BP: (!) 115/51  Pulse: 95  Weight: 182 lb 3.2 oz (82.6 kg)   Fetal Status: Fetal Heart Rate (bpm): 152   Movement: Absent     General:  Alert, oriented and cooperative. Patient is in no acute distress.  Skin: Skin is warm and dry. No rash noted.   Cardiovascular: Normal heart rate noted  Respiratory: Normal respiratory effort, no problems with respiration noted  Abdomen: Soft, gravid, appropriate for gestational age.  Pain/Pressure: Absent      Assessment and Plan:  Pregnancy: G3P2002 at [redacted]w[redacted]d 1. Supervision of other high risk pregnancy, antepartum 2. [redacted] weeks gestation of pregnancy Anatomy US scheduled 9/30 Declined genetics EKG ordered for palpitations Discussed magnesium supplement for headaches/leg pain, hydration, and trial of caffeine for headaches  3. History of C-section Undecided MOD. G1 & G2 were NRFHT  4. Antepartum multigravida of advanced  maternal age Discussed ldASA, pt accepts Declines genetic screening  5. BMI 34.0-34.9,adult Normal early A1c  6. 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 Please refer to After Visit Summary for other counseling recommendations.   Return in about 4 weeks (around 09/02/2023) for return OB at 18 weeks.  Future Appointments  Date Time Provider Department Center  09/02/2023 10:55 AM Milas Hock, MD [facility] None  09/09/2023  8:15 AM [facility] NURSE [facility]  09/09/2023  8:30 AM [facility] US3 [facility]  09/30/2023  9:55 AM Anyanwu, Jethro Bastos, MD [facility] None    Lennart Pall, MD

## 2023-09-02 ENCOUNTER — Ambulatory Visit (INDEPENDENT_AMBULATORY_CARE_PROVIDER_SITE_OTHER): Payer: Medicaid Other | Admitting: Obstetrics and Gynecology

## 2023-09-02 VITALS — BP 111/66 | HR 106 | Wt 185.0 lb

## 2023-09-02 DIAGNOSIS — Z3A18 18 weeks gestation of pregnancy: Secondary | ICD-10-CM

## 2023-09-02 DIAGNOSIS — Z98891 History of uterine scar from previous surgery: Secondary | ICD-10-CM

## 2023-09-02 DIAGNOSIS — O09529 Supervision of elderly multigravida, unspecified trimester: Secondary | ICD-10-CM

## 2023-09-02 DIAGNOSIS — O09899 Supervision of other high risk pregnancies, unspecified trimester: Secondary | ICD-10-CM

## 2023-09-02 NOTE — Progress Notes (Signed)
Patient declines Flu vaccine and AFP. Ayanni Tun l Baily Serpe, CMA

## 2023-09-02 NOTE — Progress Notes (Signed)
   PRENATAL VISIT NOTE  Subjective:  [name] is a 37 y.o. G3P2002 at [redacted]w[redacted]d being seen today for ongoing prenatal care.  She is currently monitored for the following issues for this low-risk pregnancy and has Morbid obesity (HCC); Extrinsic asthma; Primary osteoarthritis of both knees; Chondromalacia of patella; Anosmia; Rheumatoid arthritis (HCC); Supervision of other high risk pregnancy, antepartum; History of C-section; and AMA (advanced maternal age) multigravida 35+ on their problem list.  Patient reports  headaches improved but still present. She also notes lower pressure. Some flutters .  Contractions: Not present. Vag. Bleeding: None.  Movement: Absent. Denies leaking of fluid.   The following portions of the patient's history were reviewed and updated as appropriate: allergies, current medications, past family history, past medical history, past social history, past surgical history and problem list.   Objective:   Vitals:   09/02/23 1111  BP: 111/66  Pulse: (!) 106  Weight: 185 lb (83.9 kg)    Fetal Status: Fetal Heart Rate (bpm): 161   Movement: Absent     General:  Alert, oriented and cooperative. Patient is in no acute distress.  Skin: Skin is warm and dry. No rash noted.   Cardiovascular: Normal heart rate noted  Respiratory: Normal respiratory effort, no problems with respiration noted  Abdomen: Soft, gravid, appropriate for gestational age.  Pain/Pressure: Absent     Pelvic: Cervical exam deferred        Extremities: Normal range of motion.  Edema: None  Mental Status: Normal mood and affect. Normal behavior. Normal judgment and thought content.   Assessment and Plan:  Pregnancy: G3P2002 at [redacted]w[redacted]d 1. [redacted] weeks gestation of pregnancy  2. History of C-section MOD: Will discuss again at 28w to give her more time to consider.   3. Supervision of other high risk pregnancy, antepartum Anatomy scheduled for 9/30.  Declines MSAFP.  Declines flu shot.   4.  Antepartum multigravida of advanced maternal age Declined genetics  Preterm labor symptoms and general obstetric precautions including but not limited to vaginal bleeding, contractions, leaking of fluid and fetal movement were reviewed in detail with the patient. Please refer to After Visit Summary for other counseling recommendations.   No follow-ups on file.  Future Appointments  Date Time Provider Department Center  09/09/2023  8:15 AM [facility] NURSE [facility]  09/09/2023  8:30 AM [facility] US3 [facility]  09/30/2023  9:55 AM Anyanwu, Jethro Bastos, MD [facility] None  10/28/2023  8:15 AM Anyanwu, Jethro Bastos, MD [facility] None    Milas Hock, MD

## 2023-09-03 DIAGNOSIS — O99210 Obesity complicating pregnancy, unspecified trimester: Secondary | ICD-10-CM | POA: Insufficient documentation

## 2023-09-09 ENCOUNTER — Ambulatory Visit: Payer: Medicaid Other | Admitting: *Deleted

## 2023-09-09 ENCOUNTER — Other Ambulatory Visit: Payer: Self-pay | Admitting: *Deleted

## 2023-09-09 ENCOUNTER — Ambulatory Visit: Payer: Medicaid Other | Attending: Obstetrics and Gynecology

## 2023-09-09 ENCOUNTER — Encounter: Payer: Self-pay | Admitting: *Deleted

## 2023-09-09 VITALS — BP 111/67 | HR 97

## 2023-09-09 DIAGNOSIS — O99212 Obesity complicating pregnancy, second trimester: Secondary | ICD-10-CM

## 2023-09-09 DIAGNOSIS — O09899 Supervision of other high risk pregnancies, unspecified trimester: Secondary | ICD-10-CM | POA: Insufficient documentation

## 2023-09-09 DIAGNOSIS — O99210 Obesity complicating pregnancy, unspecified trimester: Secondary | ICD-10-CM | POA: Insufficient documentation

## 2023-09-09 DIAGNOSIS — E669 Obesity, unspecified: Secondary | ICD-10-CM | POA: Diagnosis not present

## 2023-09-09 DIAGNOSIS — O34219 Maternal care for unspecified type scar from previous cesarean delivery: Secondary | ICD-10-CM | POA: Diagnosis not present

## 2023-09-09 DIAGNOSIS — O09522 Supervision of elderly multigravida, second trimester: Secondary | ICD-10-CM | POA: Diagnosis not present

## 2023-09-09 DIAGNOSIS — Z3A19 19 weeks gestation of pregnancy: Secondary | ICD-10-CM

## 2023-09-23 ENCOUNTER — Telehealth: Payer: Self-pay

## 2023-09-23 ENCOUNTER — Encounter: Payer: Self-pay | Admitting: Obstetrics and Gynecology

## 2023-09-23 NOTE — Telephone Encounter (Signed)
Received message from [facility] triage line stating patient has been experiencing braxton hicks contractions. Patient states with rest and fluids, the contractions has went away.   Called to check in on patient. Patient states she is resting and will continue to monitor.

## 2023-09-27 ENCOUNTER — Encounter: Payer: Self-pay | Admitting: Obstetrics and Gynecology

## 2023-09-30 ENCOUNTER — Ambulatory Visit (INDEPENDENT_AMBULATORY_CARE_PROVIDER_SITE_OTHER): Payer: Medicaid Other | Admitting: Obstetrics & Gynecology

## 2023-09-30 ENCOUNTER — Other Ambulatory Visit (HOSPITAL_COMMUNITY)
Admission: RE | Admit: 2023-09-30 | Discharge: 2023-09-30 | Disposition: A | Discharge status: Home / Self Care | Payer: Medicaid Other | Source: Ambulatory Visit | Attending: Obstetrics & Gynecology | Admitting: Obstetrics & Gynecology

## 2023-09-30 VITALS — BP 118/62 | HR 90 | Wt 194.0 lb

## 2023-09-30 DIAGNOSIS — Z3A22 22 weeks gestation of pregnancy: Secondary | ICD-10-CM

## 2023-09-30 DIAGNOSIS — N898 Other specified noninflammatory disorders of vagina: Secondary | ICD-10-CM | POA: Diagnosis not present

## 2023-09-30 DIAGNOSIS — O09899 Supervision of other high risk pregnancies, unspecified trimester: Secondary | ICD-10-CM

## 2023-09-30 DIAGNOSIS — O23592 Infection of other part of genital tract in pregnancy, second trimester: Secondary | ICD-10-CM

## 2023-09-30 DIAGNOSIS — Z98891 History of uterine scar from previous surgery: Secondary | ICD-10-CM

## 2023-09-30 NOTE — Patient Instructions (Signed)
Oral Glucose Tolerance Test During Pregnancy Why am I having this test? The oral glucose tolerance test (GTT) is done to check how your body processes blood sugar (glucose). This is one of several tests used to diagnose diabetes that develops during pregnancy (gestational diabetes mellitus). Gestational diabetes is a short-term form of diabetes that some women develop while they are pregnant. It usually occurs during the second or third trimester of pregnancy and goes away after delivery. Testing, or screening, for gestational diabetes usually occurs around 16 of pregnancy. This test may also be needed earlier if: You have a history of gestational diabetes. There is a history of giving birth to very large babies or of losing pregnancies (having stillbirths). You have signs and symptoms of diabetes, such as: Changes in your eyesight. Tingling or numbness in your hands or feet. Changes in hunger, thirst, and urination, and these are not explained by your pregnancy. What is being tested? This test measures the amount of glucose in your blood at different times during a period of 2 hours. This shows how well your body can process glucose.  You will have three separate blood draws. What kind of sample is taken?  Blood samples are required for this test. They are usually collected by inserting a needle into a blood vessel. How do I prepare for this test? For 3 days before your test, eat normally. Have plenty of carbohydrate-rich foods. You will be asked not to eat or drink anything other than water (to fast) starting 8-10 hours before the test. Tell a health care provider about: All medicines you are taking, including vitamins, herbs, eye drops, creams, and over-the-counter medicines. Any blood disorders you have. Any surgeries you have had. Any medical conditions you have. What happens during the test? First, your blood glucose will be measured. This is referred to as your fasting blood glucose  because you fasted before the test. Then, you will drink a glucose solution that contains a certain amount of glucose. Your blood glucose will be measured again 1 and 2 hours after you drink the solution. This test takes about 2 hours to complete. You will need to stay at the testing location during this time. During the testing period: Do not eat or drink anything other than the glucose solution. Do not exercise. Do not use any products that contain nicotine or tobacco, such as cigarettes, e-cigarettes, and chewing tobacco. These can affect your test results. If you need help quitting, ask your health care provider. The testing procedure may vary among health care providers and hospitals. How are the results reported? Your results will be reported as milligrams of glucose per deciliter of blood (mg/dL) or millimoles per liter (mmol/L). There is more than one source for screening and diagnosis reference values used to diagnose gestational diabetes. Your health care provider will compare your results to normal values that were established after testing a large group of people (reference values). Reference values may vary among labs and hospitals. For this test, reference values are: Fasting: 92 mg/dL 1 hour: 295 mg/dL  2 hour: 621 mg/dL   What do the results mean? Results below the reference values are considered normal. If one or more of your blood glucose levels are at or above the reference values, you will be diagnosed with gestational diabetes.  Talk with your health care provider about what your results mean. Questions to ask your health care provider Ask your health care provider, or the department that is doing the test: When  will my results be ready? How will I get my results? What are my treatment options? What other tests do I need? What are my next steps? Summary The oral glucose tolerance test (GTT) is one of several tests used to diagnose diabetes that develops during pregnancy  (gestational diabetes mellitus). Gestational diabetes is a short-term form of diabetes that some women develop while they are pregnant. You may also have this test if you have any symptoms or risk factors for this type of diabetes. Talk with your health care provider about what your results mean. This information is not intended to replace advice given to you by your health care provider. Make sure you discuss any questions you have with your health care provider.

## 2023-09-30 NOTE — Progress Notes (Signed)
PRENATAL VISIT NOTE  Subjective:  [name] is a 37 y.o. G3P2002 at [redacted]w[redacted]d being seen today for ongoing prenatal care.  She is currently monitored for the following issues for this high-risk pregnancy and has Extrinsic asthma; Rheumatoid arthritis (HCC); Supervision of other high risk pregnancy, antepartum; History of C-section; AMA (advanced maternal age) multigravida 35+; and Obesity affecting pregnancy on their problem list.  Patient reports vaginal irritation for a few days.  Contractions: Not present. Vag. Bleeding: None.  Movement: Present. Denies leaking of fluid.   The following portions of the patient's history were reviewed and updated as appropriate: allergies, current medications, past family history, past medical history, past social history, past surgical history and problem list.   Objective:   Vitals:   09/30/23 1030  BP: 118/62  Pulse: 90  Weight: 194 lb (88 kg)    Fetal Status: Fetal Heart Rate (bpm): 147   Movement: Present     General:  Alert, oriented and cooperative. Patient is in no acute distress.  Skin: Skin is warm and dry. No rash noted.   Cardiovascular: Normal heart rate noted  Respiratory: Normal respiratory effort, no problems with respiration noted  Abdomen: Soft, gravid, appropriate for gestational age.  Pain/Pressure: Present     Pelvic: Cervical exam deferred        Extremities: Normal range of motion.  Edema: None  Mental Status: Normal mood and affect. Normal behavior. Normal judgment and thought content.   [place] MFM OB DETAIL +14 WK  Result Date: 09/09/2023 ----------------------------------------------------------------------  OBSTETRICS REPORT                       (Signed Final 09/09/2023 01:11 pm) ---------------------------------------------------------------------- Patient Info  ID #:       [id number]                          D.O.B.:  1986/03/09 (37 yrs)  Name:       [name]                  Visit Date: 09/09/2023 08:27 am  ---------------------------------------------------------------------- Performed By  Attending:        Braxton Feathers DO       Ref. Address:     [facility]  Performed By:     Anabel Halon          [facility]                                                             [facility]  Referred By:      Milas Hock                    MD ---------------------------------------------------------------------- Orders  #  Description                           Code        Ordered By  1  [place] MFM OB DETAIL +14 WK               76811.01    Milas Hock ----------------------------------------------------------------------  #  Order #                     Accession #                Episode #  1  [id number]                   [id number]                 [id number] ---------------------------------------------------------------------- Indications  Obesity complicating pregnancy, second         O99.212  trimester (BMI 36)  Advanced maternal age multigravida 75+,        O58.522  second trimester (37 yo)  [redacted] weeks gestation of pregnancy                Z3A.19  Encounter for antenatal screening for          Z36.3  malformations  History of cesarean delivery, currently        O34.219  pregnant  Declined Genetic Testing ---------------------------------------------------------------------- Fetal Evaluation  Num Of Fetuses:         1  Fetal Heart Rate(bpm):  148  Cardiac Activity:       Observed  Presentation:           Transverse, head to maternal right  Placenta:               Anterior  P. Cord Insertion:      Visualized  Amniotic Fluid  AFI FV:      Within normal limits                              Largest Pocket(cm)                              5.8 ---------------------------------------------------------------------- Biometry  BPD:      44.9  mm     G. Age:  19w 4d         63  %     CI:        71.42   %    70 - 86                                                          FL/HC:      17.6   %    16.1 - 18.3  HC:      169.2  mm     G. Age:  19w 4d         56  %    HC/AC:  1.20        1.09 - 1.39  AC:      140.8  mm     G. Age:  19w 3d         51  %    FL/BPD:     66.1   %  FL:       29.7  mm     G. Age:  19w 1d         38  %    FL/AC:      21.1   %    20 - 24  HUM:      29.4  mm     G. Age:  19w 4d         59  %  CER:      18.2  mm     G. Age:  18w 0d          8  %  NFT:       4.3  mm  LV:        8.7  mm  CM:        4.7  mm  Est. FW:     288  gm    0 lb 10 oz      50  % ---------------------------------------------------------------------- OB History  Blood Type:   O+  Maternal Racial/Ethnic Group:   Black (non-Hispanic) ---------------------------------------------------------------------- Gestational Age  LMP:           19w 2d        Date:  04/27/23                  EDD:   02/01/24  U/S Today:     19w 3d                                        EDD:   01/31/24  Best:          19w 2d     Det. By:  LMP  (04/27/23)          EDD:   02/01/24 ---------------------------------------------------------------------- Targeted Anatomy  Central Nervous System  Calvarium/Cranial V.:  Appears normal         Cereb./Vermis:          Appears normal  Cavum:                 Appears normal         Cisterna Magna:         Appears normal  Lateral Ventricles:    Appears normal         Midline Falx:           Appears normal  Choroid Plexus:        Appears normal  Spine  Cervical:              Not well visualized    Sacral:                 Not well visualized  Thoracic:              Not well visualized    Shape/Curvature:        Not well visualized  Lumbar:                Not well visualized  Head/Neck  Face:  Appears normal         Nasal Bone:             Appears normal  Lips:                  Appears normal         Profile:                Appears normal  Nuchal Fold:           Appears normal          Orbits/Eyes:            Appears normal  Thorax  4 Chamber View:        Appears normal         SVC:                    Appears normal  Cardiac Activity:      Observed               Interventr. Septum:     Appears normal  Cardiac Situs:         Appears normal         Cardiac Axis:           Appears normal  Rt Outflow Tract:      Appears normal         Diaphragm:              Appears normal  Lt Outflow Tract:      Appears normal         3 Vessel View:          Appears normal  Aortic Arch:           Appears normal         3 V Trachea View:       Appears normal  Ductal Arch:           Appears normal         IVC:                    Appears normal  Abdomen  Ventral Wall:          Appears normal         Lt Kidney:              Appears normal  Cord Insertion:        Appears normal         Rt Kidney:              Appears normal  Situs:                 Within normal limits   Bladder:                Appears normal  Stomach:               Appears normal  Extremities  Lt Humerus:            Appears normal         Lt Femur:               Appears normal  Rt Humerus:            Appears normal         Rt Femur:               Appears normal  Lt Forearm:  Appears normal         Lt Lower Leg:           Appears normal  Rt Forearm:            Appears normal         Rt Lower Leg:           Appears normal  Lt Hand:               Open hand nml          Lt Foot:                Nml heel/foot  Rt Hand:               Open hand nml          Rt Foot:                Nml heel/foot  Other  Umbilical Cord:        Normal 3-vessel        Genitalia:              Female-nml  Comment:     Technically difficult due to fetal position and movement. ---------------------------------------------------------------------- Cervix Uterus Adnexa  Cervix  Length:            3.6  cm.  Normal appearance by transabdominal scan  Uterus  No abnormality visualized.  Right Ovary  Within normal limits.  Left Ovary  Within normal limits.  Cul De Sac  No free fluid  seen.  Adnexa  No abnormality visualized ---------------------------------------------------------------------- Comments  The patient is here for an anatomy ultrasound.  Sonographic findings  Single intrauterine pregnancy at 19w 2d.  Fetal cardiac activity:  Observed and appears normal.  Presentation: Transverse, head to maternal right.  The anatomic structures that were well seen appear normal  without evidence of soft markers. Due to poor acoustic  windows some structures remain suboptimally visualized.  Fetal biometry shows the estimated fetal weight at the 50  percentile.  Amniotic fluid:  MVP: 5.8 cm.  Placenta: Anterior.  Adnexa: No abnormality visualized.  Cervical length: 3.6 cm.  Recommendations  - EDD should be 02/01/2024 based on  LMP  (04/27/23).  - Follow up ultrasound in 4-6 weeks to attempt visualization of  the anatomy not seen and reassess the fetal growth  - Aneuploidy screening was offered but declined  There are limitations of prenatal ultrasound such as the  inability to detect certain abnormalities due to poor  visualization. Various factors such as fetal position,  gestational age and maternal body habitus may increase the  difficulty in visualizing the fetal anatomy. ----------------------------------------------------------------------                  Braxton Feathers, DO Electronically Signed Final Report   09/09/2023 01:11 pm ----------------------------------------------------------------------    Assessment and Plan:  Pregnancy: G3P2002 at [redacted]w[redacted]d 1. Vaginitis affecting pregnancy in second trimester, antepartum Self swab done, will follow up results and manage accordingly. - Cervicovaginal ancillary only  2. History of C-section x 2 Undecided MOD, discuss next visit.  3. [redacted] weeks gestation of pregnancy 4. Supervision of other high risk pregnancy, antepartum No other concerns.  Preterm labor symptoms and general obstetric precautions including but not limited to vaginal bleeding,  contractions, leaking of fluid and fetal movement were reviewed in detail with the patient. Please refer to After Visit Summary for other counseling recommendations.   Return for 2 hr GTT,  3rd trimester labs, OFFICE OB VISIT (MD or APP).  Future Appointments  Date Time Provider Department Center  10/14/2023  9:30 AM [facility] US[facility]  10/28/2023  8:15 AM Maily Debarge, Jethro Bastos, MD [facility] None    Jaynie Collins, MD

## 2023-10-01 LAB — CERVICOVAGINAL ANCILLARY ONLY
Bacterial Vaginitis (gardnerella): NEGATIVE
Candida Glabrata: NEGATIVE
Candida Vaginitis: POSITIVE — AB
Comment: NEGATIVE
Comment: NEGATIVE
Comment: NEGATIVE

## 2023-10-01 MED ORDER — CLOTRIMAZOLE 1 % VA CREA
1.0000 | TOPICAL_CREAM | Freq: Every day | VAGINAL | 2 refills | Status: AC
Start: 2023-10-01 — End: 2023-10-08

## 2023-10-01 NOTE — Addendum Note (Signed)
Addended by: Jaynie Collins A on: 10/01/2023 01:28 PM   Modules accepted: Orders

## 2023-10-14 ENCOUNTER — Other Ambulatory Visit: Payer: Self-pay

## 2023-10-14 ENCOUNTER — Ambulatory Visit: Payer: Medicaid Other | Attending: Maternal & Fetal Medicine

## 2023-10-14 ENCOUNTER — Other Ambulatory Visit: Payer: Self-pay | Admitting: *Deleted

## 2023-10-14 DIAGNOSIS — O99212 Obesity complicating pregnancy, second trimester: Secondary | ICD-10-CM | POA: Diagnosis not present

## 2023-10-14 DIAGNOSIS — O09522 Supervision of elderly multigravida, second trimester: Secondary | ICD-10-CM | POA: Insufficient documentation

## 2023-10-14 DIAGNOSIS — Z3A24 24 weeks gestation of pregnancy: Secondary | ICD-10-CM

## 2023-10-14 DIAGNOSIS — O34219 Maternal care for unspecified type scar from previous cesarean delivery: Secondary | ICD-10-CM

## 2023-10-14 DIAGNOSIS — O402XX0 Polyhydramnios, second trimester, not applicable or unspecified: Secondary | ICD-10-CM | POA: Diagnosis not present

## 2023-10-14 DIAGNOSIS — O09529 Supervision of elderly multigravida, unspecified trimester: Secondary | ICD-10-CM

## 2023-10-14 DIAGNOSIS — O99210 Obesity complicating pregnancy, unspecified trimester: Secondary | ICD-10-CM

## 2023-10-14 DIAGNOSIS — E669 Obesity, unspecified: Secondary | ICD-10-CM | POA: Diagnosis not present

## 2023-10-14 DIAGNOSIS — O409XX0 Polyhydramnios, unspecified trimester, not applicable or unspecified: Secondary | ICD-10-CM

## 2023-10-28 ENCOUNTER — Ambulatory Visit (INDEPENDENT_AMBULATORY_CARE_PROVIDER_SITE_OTHER): Payer: Medicaid Other | Admitting: Obstetrics & Gynecology

## 2023-10-28 VITALS — BP 117/63 | HR 96 | Wt 201.0 lb

## 2023-10-28 DIAGNOSIS — Z3A26 26 weeks gestation of pregnancy: Secondary | ICD-10-CM | POA: Diagnosis not present

## 2023-10-28 DIAGNOSIS — O409XX0 Polyhydramnios, unspecified trimester, not applicable or unspecified: Secondary | ICD-10-CM | POA: Insufficient documentation

## 2023-10-28 DIAGNOSIS — Z98891 History of uterine scar from previous surgery: Secondary | ICD-10-CM

## 2023-10-28 DIAGNOSIS — O09899 Supervision of other high risk pregnancies, unspecified trimester: Secondary | ICD-10-CM

## 2023-10-28 NOTE — Progress Notes (Signed)
PRENATAL VISIT NOTE  Subjective:  [name] is a 37 y.o. G3P2002 at [redacted]w[redacted]d being seen today for ongoing prenatal care.  She is currently monitored for the following issues for this high-risk pregnancy and has Extrinsic asthma; Rheumatoid arthritis (HCC); Supervision of other high risk pregnancy, antepartum; History of C-section x 2; AMA (advanced maternal age) multigravida 35+; Obesity affecting pregnancy; and Polyhydramnios affecting pregnancy on their problem list.  Patient reports no complaints.  Contractions: Not present. Vag. Bleeding: None.  Movement: Present. Denies leaking of fluid.   The following portions of the patient's history were reviewed and updated as appropriate: allergies, current medications, past family history, past medical history, past social history, past surgical history and problem list.   Objective:   Vitals:   10/28/23 0824  BP: 117/63  Pulse: 96  Weight: 201 lb (91.2 kg)    Fetal Status: Fetal Heart Rate (bpm): 146   Movement: Present     General:  Alert, oriented and cooperative. Patient is in no acute distress.  Skin: Skin is warm and dry. No rash noted.   Cardiovascular: Normal heart rate noted  Respiratory: Normal respiratory effort, no problems with respiration noted  Abdomen: Soft, gravid, appropriate for gestational age.  Pain/Pressure: Present     Pelvic: Cervical exam deferred        Extremities: Normal range of motion.  Edema: None  Mental Status: Normal mood and affect. Normal behavior. Normal judgment and thought content.   [place] MFM OB FOLLOW UP  Result Date: 10/14/2023 ----------------------------------------------------------------------  OBSTETRICS REPORT                       (Signed Final 10/14/2023 11:21 am) ---------------------------------------------------------------------- Patient Info  ID #:       [id number]                          D.O.B.:  [birth year] (37 yrs)  Name:       [name]                  Visit Date: 10/14/2023  09:34 am ---------------------------------------------------------------------- Performed By  Attending:        Noralee Space MD        Ref. Address:     [facility]  Performed By:     Marcellina Millin       [facility]                                                             [facility]  Referred By:      Milas Hock                    MD ---------------------------------------------------------------------- Orders  #  Description                           Code        Ordered By  1  [place] MFM OB FOLLOW UP                   40981.19    Braxton Feathers ----------------------------------------------------------------------  #  Order #                     Accession #                Episode #  1  [id number]                   [id number]                 [id number] ---------------------------------------------------------------------- Indications  Obesity complicating pregnancy, second         O99.212  trimester (BMI 36)  Advanced maternal age multigravida 65+,        O61.522  second trimester (37 yo)  Polyhydramnios, second trimester,              O40.2XX0  antepartum condition or complication, fetus  unspecified  History of cesarean delivery, currently        O34.219  pregnant  Antenatal follow-up for nonvisualized fetal    Z36.2  anatomy  [redacted] weeks gestation of pregnancy                Z3A.24  Declined Genetic Testing ---------------------------------------------------------------------- Fetal Evaluation  Num Of Fetuses:         1  Fetal Heart Rate(bpm):  153  Cardiac Activity:       Observed  Presentation:           Cephalic  Placenta:               Anterior  P. Cord Insertion:      Previously seen  Amniotic Fluid  AFI FV:      Polyhydramnios                              Largest Pocket(cm)                              10.53  ---------------------------------------------------------------------- Biometry  BPD:      63.3  mm     G. Age:  25w 4d         86  %    CI:        73.23   %    70 - 86                                                          FL/HC:      17.4   %    18.7 - 20.9  HC:      235.1  mm     G. Age:  25w  4d         77  %    HC/AC:      1.20        1.05 - 1.21  AC:      195.2  mm     G. Age:  24w 1d         38  %    FL/BPD:     64.5   %    71 - 87  FL:       40.8  mm     G. Age:  23w 2d         11  %    FL/AC:      20.9   %    20 - 24  CER:      26.6  mm     G. Age:  23w 6d         50  %  LV:        6.4  mm  Est. FW:     654  gm      1 lb 7 oz     30  % ---------------------------------------------------------------------- OB History  Blood Type:   O+  Maternal Racial/Ethnic Group:   Black (non-Hispanic) ---------------------------------------------------------------------- Gestational Age  LMP:           24w 2d        Date:  04/27/23                  EDD:   02/01/24  U/S Today:     24w 5d                                        EDD:   01/29/24  Best:          24w 2d     Det. By:  LMP  (04/27/23)          EDD:   02/01/24 ---------------------------------------------------------------------- Targeted Anatomy  Central Nervous System  Calvarium/Cranial V.:  Appears normal         Cereb./Vermis:          Appears normal  Cavum:                 Appears normal         Cisterna Magna:         Appears normal  Lateral Ventricles:    Appears normal         Midline Falx:           Appears normal  Choroid Plexus:        Previously seen  Spine  Cervical:              Appears normal         Sacral:                 Appears normal  Thoracic:              Appears normal         Shape/Curvature:        Appears normal  Lumbar:                Appears normal  Head/Neck  Face:                  Appears normal         Nasal Bone:  Appears normal  Lips:                  Previously seen        Profile:                Appears normal  Nuchal  Fold:           Previously seen        Orbits/Eyes:            Previously seen  Thorax  4 Chamber View:        Previously seen        Interventr. Septum:     Previously seen  Cardiac Activity:      Observed               Cardiac Axis:           Previously een  Cardiac Situs:         Previously seen        Diaphragm:              Previously seen  Rt Outflow Tract:      Previously seen        3 Vessel View:          Previously seen  Lt Outflow Tract:      Previously seen        3 V Trachea View:       Previously seen  Aortic Arch:           Previously seen        IVC:                    Previously seen  Ductal Arch:           Previously seen        Crossing:               Previously seen  SVC:                   Previously seen  Abdomen  Ventral Wall:          Previously seen        Lt Kidney:              Appears normal  Cord Insertion:        Previously seen        Rt Kidney:              Appears normal  Situs:                 Previously seen        Bladder:                Appears normal  Stomach:               Appears normal  Extremities  Lt Humerus:            Previously seen        Lt Femur:               Previously seen  Rt Humerus:            Previously seen        Rt Femur:               Previously seen  Lt Forearm:            Previously seen  Lt Lower Leg:           Previously seen  Rt Forearm:            Previously seen        Rt Lower Leg:           Previously seen  Lt Hand:               Previously seen        Lt Foot:                Previously seen  Rt Hand:               Previously seen        Rt Foot:                Previously seen  Other  Umbilical Cord:        Previously seen        Genitalia:              Female prev seen  Comment:     Fetal anatomic survey complete. ---------------------------------------------------------------------- Cervix Uterus Adnexa  Cervix  Length:              5  cm.  Normal appearance by transabdominal scan  ---------------------------------------------------------------------- Impression  PolyPatient returned for completion of fetal anatomy.  Fetal growth is appropriate for gestational age.  Polyhydramnios is seen (AFI 10 cm).  Good fetal activity is  present.  Fetal spine appears normal.  Fetal stomach and  kidneys appear normal.  Placenta is anterior and there is no evidence of previa or  placenta accreta spectrum.  I explained the finding of polyhydramnios and encouraged  her to have screening for gestational diabetes. ---------------------------------------------------------------------- Recommendations  -An appointment was made for her to return in 4 weeks for  fetal growth and amniotic fluid assessments  -Screen for gestational diabetes. ----------------------------------------------------------------------                 Noralee Space, MD Electronically Signed Final Report   10/14/2023 11:21 am ----------------------------------------------------------------------    Assessment and Plan:  Pregnancy: [id number] at [redacted]w[redacted]d 1. Polyhydramnios affecting pregnancy GTT being done today.  Follow up scan as per MFM. - Glucose Tolerance, 2 Hours w/1 Hour  2. History of C-section x 2 VBAC vs RCS risks and benefits reviewed, copy of consent given to patient. She will review and let us know. If she has RCS, she is interested in possible BTS, will consider both sets of papers next visit.  3. [redacted] weeks gestation of pregnancy 4. Supervision of other high risk pregnancy, antepartum GTT and labs today, will follow up results and manage accordingly. - Glucose Tolerance, 2 Hours w/1 Hour - CBC - RPR - HIV Antibody (routine testing w rflx) Preterm labor symptoms and general obstetric precautions including but not limited to vaginal bleeding, contractions, leaking of fluid and fetal movement were reviewed in detail with the patient. Please refer to After Visit Summary for other counseling recommendations.   Return in  about 29 days (around 11/26/2023) for OFFICE OB VISIT (MD only).  Future Appointments  Date Time Provider Department Center  11/15/2023  3:30 PM [facility] US3 [facility]  11/26/2023  8:35 AM Gerrit Heck, CNM [facility] None    Jaynie Collins, MD

## 2023-10-28 NOTE — Addendum Note (Signed)
Addended by: Jyl Heinz on: 10/28/2023 09:13 AM   Modules accepted: Orders

## 2023-10-28 NOTE — Progress Notes (Signed)
Patient was erroneously given 50 g instead of 75 g, and this was discovered too late to rectify it. GTT changed to 1 hr GTT, apologies given to patient. Will follow up results and manage accordingly.   Jaynie Collins, MD

## 2023-10-28 NOTE — Patient Instructions (Signed)

## 2023-10-29 LAB — GLUCOSE TOLERANCE, 1 HOUR: Glucose, 1Hr PP: 87 mg/dL (ref 70–199)

## 2023-10-29 LAB — CBC
Hematocrit: 38 % (ref 34.0–46.6)
Hemoglobin: 12.3 g/dL (ref 11.1–15.9)
MCH: 27 pg (ref 26.6–33.0)
MCHC: 32.4 g/dL (ref 31.5–35.7)
MCV: 84 fL (ref 79–97)
Platelets: 308 10*3/uL (ref 150–450)
RBC: 4.55 x10E6/uL (ref 3.77–5.28)
RDW: 12.7 % (ref 11.7–15.4)
WBC: 6.9 10*3/uL (ref 3.4–10.8)

## 2023-10-29 LAB — HIV ANTIBODY (ROUTINE TESTING W REFLEX): HIV Screen 4th Generation wRfx: NONREACTIVE

## 2023-10-29 LAB — RPR: RPR Ser Ql: NONREACTIVE

## 2023-11-15 ENCOUNTER — Ambulatory Visit: Payer: Medicaid Other | Attending: Obstetrics and Gynecology

## 2023-11-15 ENCOUNTER — Other Ambulatory Visit: Payer: Self-pay | Admitting: *Deleted

## 2023-11-15 DIAGNOSIS — Z3A28 28 weeks gestation of pregnancy: Secondary | ICD-10-CM | POA: Diagnosis not present

## 2023-11-15 DIAGNOSIS — O99210 Obesity complicating pregnancy, unspecified trimester: Secondary | ICD-10-CM

## 2023-11-15 DIAGNOSIS — O409XX0 Polyhydramnios, unspecified trimester, not applicable or unspecified: Secondary | ICD-10-CM

## 2023-11-15 DIAGNOSIS — O09529 Supervision of elderly multigravida, unspecified trimester: Secondary | ICD-10-CM

## 2023-11-15 DIAGNOSIS — O403XX0 Polyhydramnios, third trimester, not applicable or unspecified: Secondary | ICD-10-CM | POA: Diagnosis not present

## 2023-11-15 DIAGNOSIS — O34219 Maternal care for unspecified type scar from previous cesarean delivery: Secondary | ICD-10-CM

## 2023-11-15 DIAGNOSIS — O09523 Supervision of elderly multigravida, third trimester: Secondary | ICD-10-CM | POA: Diagnosis not present

## 2023-11-15 DIAGNOSIS — O99213 Obesity complicating pregnancy, third trimester: Secondary | ICD-10-CM | POA: Diagnosis not present

## 2023-11-15 DIAGNOSIS — E669 Obesity, unspecified: Secondary | ICD-10-CM

## 2023-11-26 ENCOUNTER — Ambulatory Visit (INDEPENDENT_AMBULATORY_CARE_PROVIDER_SITE_OTHER): Payer: Medicaid Other

## 2023-11-26 VITALS — BP 118/74 | HR 96 | Wt 208.0 lb

## 2023-11-26 DIAGNOSIS — Z3A30 30 weeks gestation of pregnancy: Secondary | ICD-10-CM

## 2023-11-26 DIAGNOSIS — O09893 Supervision of other high risk pregnancies, third trimester: Secondary | ICD-10-CM

## 2023-11-26 DIAGNOSIS — O403XX1 Polyhydramnios, third trimester, fetus 1: Secondary | ICD-10-CM

## 2023-11-26 DIAGNOSIS — O409XX0 Polyhydramnios, unspecified trimester, not applicable or unspecified: Secondary | ICD-10-CM

## 2023-11-26 DIAGNOSIS — Z98891 History of uterine scar from previous surgery: Secondary | ICD-10-CM

## 2023-11-26 DIAGNOSIS — O09899 Supervision of other high risk pregnancies, unspecified trimester: Secondary | ICD-10-CM

## 2023-11-26 NOTE — Progress Notes (Signed)
   HIGH-RISK PREGNANCY OFFICE VISIT  Patient name: [name] MRN [id number]  Date of birth: [birth year] Chief Complaint:   Routine Prenatal Visit  Subjective:   [name] is a 37 y.o. G87P2002 female at [redacted]w[redacted]d with an Estimated Date of Delivery: 02/01/24 being seen today for ongoing management of a High-risk pregnancy aeb has Extrinsic asthma; Rheumatoid arthritis (HCC); Supervision of other high risk pregnancy, antepartum; History of C-section x 2; AMA (advanced maternal age) multigravida 35+; Obesity affecting pregnancy; and Polyhydramnios affecting pregnancy on their problem list.  Patient presents today, alone , with occasional contractions.  She states she has occasional tightness with radiation to her middle back.  She reports last night she had intermittent back pain that lasted about one hour.  Patient endorses fetal movement.  Patient denies vaginal concerns including abnormal discharge, leaking of fluid, and bleeding. No issues with urination, constipation, or diarrhea.    Contractions: Not present. Vag. Bleeding: None.  Movement: Present.  Reviewed past medical,surgical, social, obstetrical and family history as well as problem list, medications and allergies.  Objective   Vitals:   11/26/23 0912  BP: 118/74  Pulse: 96  Weight: 208 lb (94.3 kg)  Body mass index is 44.23 kg/m.  Total Weight Gain:45 lb (20.4 kg)         Physical Examination:   General appearance: Well appearing, and in no distress  Mental status: Alert, oriented to person, place, and time  Skin: Warm & dry  Cardiovascular: Normal heart rate noted  Respiratory: Normal respiratory effort, no distress  Abdomen: Soft, gravid, nontender, LGA with Fundal Height: 35 cm  Pelvic: Cervical exam deferred           Extremities: Edema: None  Fetal Status: Fetal Heart Rate (bpm): 132  Movement: Present   No results found for this or any previous visit (from the past 24 hours).  Assessment & Plan:  High-risk  pregnancy of a 37 y.o., G3P2002 at [redacted]w[redacted]d with an Estimated Date of Delivery: 02/01/24   1. Supervision of other high risk pregnancy, antepartum -Anticipatory guidance for upcoming appts. -Patient to schedule next appt in 2-3 weeks for an in-person visit.  2. [redacted] weeks gestation of pregnancy -Doing well overall. -Encouraged to monitor back pain. Informed that back labor usually occurs in lower back.  -Unsure of permanent sterilization vs IUD. -Encouraged to sign BTL papers.  Informed that if she desires IUD it can be placed after vaginal delivery or C/S! She just needs to inform necessary parties.  3. History of C-section x 2 -Remains unsure of MOD, but strongly considering TOLAC. -Encouraged to sign papers today. -Patient expresses fear with unknown. Given support to process and reflect.  -Encouraged to remain open to possibility of anything. -Reassured that EVERY mode of delivery can be scary! Support given.   4. Polyhydramnios affecting pregnancy -FH 35 today, likely d/t known poly. -Scheduled for growth [place] on 12/12/2023     Meds: No orders of the defined types were placed in this encounter.  Labs/procedures today:  Lab Orders  No laboratory test(s) ordered today     Reviewed: Preterm labor symptoms and general obstetric precautions including but not limited to vaginal bleeding, contractions, leaking of fluid and fetal movement were reviewed in detail with the patient.  All questions were answered.  Follow-up: No follow-ups on file.  No orders of the defined types were placed in this encounter.  Cherre Robins MSN, CNM 11/26/2023

## 2023-11-28 ENCOUNTER — Other Ambulatory Visit: Payer: Self-pay

## 2023-12-12 ENCOUNTER — Other Ambulatory Visit: Payer: Self-pay

## 2023-12-12 ENCOUNTER — Ambulatory Visit: Payer: Medicaid Other | Attending: Obstetrics

## 2023-12-12 ENCOUNTER — Other Ambulatory Visit: Payer: Self-pay | Admitting: *Deleted

## 2023-12-12 DIAGNOSIS — O09523 Supervision of elderly multigravida, third trimester: Secondary | ICD-10-CM

## 2023-12-12 DIAGNOSIS — Z98891 History of uterine scar from previous surgery: Secondary | ICD-10-CM

## 2023-12-12 DIAGNOSIS — M069 Rheumatoid arthritis, unspecified: Secondary | ICD-10-CM

## 2023-12-12 DIAGNOSIS — E669 Obesity, unspecified: Secondary | ICD-10-CM | POA: Diagnosis not present

## 2023-12-12 DIAGNOSIS — O409XX0 Polyhydramnios, unspecified trimester, not applicable or unspecified: Secondary | ICD-10-CM

## 2023-12-12 DIAGNOSIS — O403XX0 Polyhydramnios, third trimester, not applicable or unspecified: Secondary | ICD-10-CM | POA: Diagnosis not present

## 2023-12-12 DIAGNOSIS — O99213 Obesity complicating pregnancy, third trimester: Secondary | ICD-10-CM | POA: Diagnosis not present

## 2023-12-12 DIAGNOSIS — O34219 Maternal care for unspecified type scar from previous cesarean delivery: Secondary | ICD-10-CM

## 2023-12-12 DIAGNOSIS — Z3A32 32 weeks gestation of pregnancy: Secondary | ICD-10-CM | POA: Diagnosis not present

## 2023-12-13 ENCOUNTER — Ambulatory Visit (INDEPENDENT_AMBULATORY_CARE_PROVIDER_SITE_OTHER): Payer: Medicaid Other | Admitting: Obstetrics and Gynecology

## 2023-12-13 VITALS — BP 126/70 | HR 100 | Wt 210.0 lb

## 2023-12-13 DIAGNOSIS — Z3A32 32 weeks gestation of pregnancy: Secondary | ICD-10-CM

## 2023-12-13 DIAGNOSIS — O409XX0 Polyhydramnios, unspecified trimester, not applicable or unspecified: Secondary | ICD-10-CM

## 2023-12-13 DIAGNOSIS — Z98891 History of uterine scar from previous surgery: Secondary | ICD-10-CM

## 2023-12-13 DIAGNOSIS — O09899 Supervision of other high risk pregnancies, unspecified trimester: Secondary | ICD-10-CM

## 2023-12-13 NOTE — Progress Notes (Signed)
   PRENATAL VISIT NOTE  Subjective:  [name] is a 38 y.o. G3P2002 at [redacted]w[redacted]d being seen today for ongoing prenatal care.  She is currently monitored for the following issues for this high-risk pregnancy and has Extrinsic asthma; Rheumatoid arthritis (HCC); Supervision of other high risk pregnancy, antepartum; History of C-section x 2; AMA (advanced maternal age) multigravida 35+; Obesity affecting pregnancy; and Polyhydramnios affecting pregnancy on their problem list.  Patient reports no complaints.  Contractions: Not present. Vag. Bleeding: None.  Movement: Present. Denies leaking of fluid.   Not 100% sure about tubal but does not think she would want it if she has a vaginal delivery. Notes   The following portions of the patient's history were reviewed and updated as appropriate: allergies, current medications, past family history, past medical history, past social history, past surgical history and problem list.   Objective:   Vitals:   12/13/23 0928  BP: 126/70  Pulse: 100  Weight: 210 lb (95.3 kg)    Fetal Status: Fetal Heart Rate (bpm): 148   Movement: Present     General:  Alert, oriented and cooperative. Patient is in no acute distress.  Skin: Skin is warm and dry. No rash noted.   Cardiovascular: Normal heart rate noted  Respiratory: Normal respiratory effort, no problems with respiration noted  Abdomen: Soft, gravid, appropriate for gestational age.  Pain/Pressure: Absent     Pelvic: Cervical exam deferred        Extremities: Normal range of motion.  Edema: None  Mental Status: Normal mood and affect. Normal behavior. Normal judgment and thought content.   Assessment and Plan:  Pregnancy: G3P2002 at [redacted]w[redacted]d 1. [redacted] weeks gestation of pregnancy (Primary) FHR wnl  2. Supervision of other high risk pregnancy, antepartum  3. History of C-section x 2 Leaning to [facility] Consent signed  4. Polyhydramnios affecting pregnancy AFI 25 - recommend immediate presentation to  care if PPROM Cephalic presentation Growth US  Q4-6w @ 28w Weekly testing: None Delivery recommendation: 39.0-40.6w    Preterm labor symptoms and general obstetric precautions including but not limited to vaginal bleeding, contractions, leaking of fluid and fetal movement were reviewed in detail with the patient. Please refer to After Visit Summary for other counseling recommendations.   No follow-ups on file.  Future Appointments  Date Time Provider Department Center  12/23/2023  9:15 AM Ilean Norleen GAILS, MD [facility] None  01/08/2024  8:55 AM Stinson, Jacob J, DO [facility] None  01/09/2024  3:30 PM [facility] US4 [facility]  01/13/2024  8:35 AM Ilean, Norleen GAILS, MD [facility] None  01/23/2024  1:50 PM Stinson, Jacob J, DO [facility] None  01/29/2024  9:55 AM Barbra, Jacob J, DO [facility] None    Carter Quarry, MD

## 2023-12-23 ENCOUNTER — Ambulatory Visit (INDEPENDENT_AMBULATORY_CARE_PROVIDER_SITE_OTHER): Payer: Medicaid Other | Admitting: Family Medicine

## 2023-12-23 VITALS — BP 131/73 | HR 107 | Wt 210.0 lb

## 2023-12-23 DIAGNOSIS — O09893 Supervision of other high risk pregnancies, third trimester: Secondary | ICD-10-CM

## 2023-12-23 DIAGNOSIS — Z23 Encounter for immunization: Secondary | ICD-10-CM | POA: Diagnosis not present

## 2023-12-23 DIAGNOSIS — O321XX0 Maternal care for breech presentation, not applicable or unspecified: Secondary | ICD-10-CM

## 2023-12-23 DIAGNOSIS — Z3A34 34 weeks gestation of pregnancy: Secondary | ICD-10-CM

## 2023-12-23 DIAGNOSIS — O34219 Maternal care for unspecified type scar from previous cesarean delivery: Secondary | ICD-10-CM

## 2023-12-23 DIAGNOSIS — Z98891 History of uterine scar from previous surgery: Secondary | ICD-10-CM

## 2023-12-23 DIAGNOSIS — O409XX0 Polyhydramnios, unspecified trimester, not applicable or unspecified: Secondary | ICD-10-CM

## 2023-12-23 DIAGNOSIS — O09523 Supervision of elderly multigravida, third trimester: Secondary | ICD-10-CM

## 2023-12-23 DIAGNOSIS — O09899 Supervision of other high risk pregnancies, unspecified trimester: Secondary | ICD-10-CM

## 2023-12-23 NOTE — Progress Notes (Signed)
   PRENATAL VISIT NOTE  Subjective:  [name] is a 38 y.o. G3P2002 at [redacted]w[redacted]d being seen today for ongoing prenatal care.  She is currently monitored for the following issues for this high-risk pregnancy and has Extrinsic asthma; Rheumatoid arthritis (HCC); Supervision of other high risk pregnancy, antepartum; History of C-section x 2; AMA (advanced maternal age) multigravida 35+; Obesity affecting pregnancy; and Polyhydramnios affecting pregnancy on their problem list.  Patient reports no bleeding, no contractions, no cramping, and no leaking.  Contractions: Irregular. Vag. Bleeding: None.  Movement: Present. Denies leaking of fluid.   The following portions of the patient's history were reviewed and updated as appropriate: allergies, current medications, past family history, past medical history, past social history, past surgical history and problem list.   Objective:   Vitals:   12/23/23 0932  BP: 131/73  Pulse: (!) 107  Weight: 210 lb (95.3 kg)    Fetal Status: Fetal Heart Rate (bpm): 138   Movement: Present     General:  Alert, oriented and cooperative. Patient is in no acute distress.  Skin: Skin is warm and dry. No rash noted.   Cardiovascular: Normal heart rate noted  Respiratory: Normal respiratory effort, no problems with respiration noted  Abdomen: Soft, gravid, appropriate for gestational age.  Pain/Pressure: Absent     Pelvic: Cervical exam deferred        Extremities: Normal range of motion.  Edema: None  Mental Status: Normal mood and affect. Normal behavior. Normal judgment and thought content.   Assessment and Plan:  Pregnancy: G3P2002 at [redacted]w[redacted]d 1. Supervision of other high risk pregnancy, antepartum (Primary) FHR and BP appropriate today RSV vaccine given today Tdap given today  2. History of C-section x 2 Desiring VBAC if possible. If she has a C-section she is considering a tubal ligation but if no C-section considering IUD  3. Polyhydramnios affecting  pregnancy AFI 25 on last visit.  Repeat ultrasound scheduled for 1/30  4. Multigravida of advanced maternal age in third trimester  5. [redacted] weeks gestation of pregnancy  6. Breech presentation with antenatal problem, single or unspecified fetus Footling breech on evaluation, is open to a version at 37 weeks if still breech at next visit  Preterm labor symptoms and general obstetric precautions including but not limited to vaginal bleeding, contractions, leaking of fluid and fetal movement were reviewed in detail with the patient. Please refer to After Visit Summary for other counseling recommendations.   No follow-ups on file.  Future Appointments  Date Time Provider Department Center  01/08/2024  8:55 AM Stinson, Jacob J, DO [facility] None  01/09/2024  3:30 PM [facility] US4 [facility]  01/13/2024  8:35 AM Ilean Norleen GAILS, MD [facility] None  01/23/2024  1:50 PM Stinson, Jacob J, DO [facility] None  01/29/2024  9:55 AM Stinson, Jacob J, DO [facility] None    Norleen GAILS Ilean, MD

## 2024-01-08 ENCOUNTER — Other Ambulatory Visit (HOSPITAL_COMMUNITY)
Admission: RE | Admit: 2024-01-08 | Discharge: 2024-01-08 | Disposition: A | Discharge status: Home / Self Care | Payer: Medicaid Other | Source: Ambulatory Visit | Attending: Family Medicine | Admitting: Family Medicine

## 2024-01-08 ENCOUNTER — Ambulatory Visit (INDEPENDENT_AMBULATORY_CARE_PROVIDER_SITE_OTHER): Payer: Medicaid Other | Admitting: Family Medicine

## 2024-01-08 VITALS — BP 123/66 | HR 99 | Wt 216.0 lb

## 2024-01-08 DIAGNOSIS — M059 Rheumatoid arthritis with rheumatoid factor, unspecified: Secondary | ICD-10-CM

## 2024-01-08 DIAGNOSIS — O409XX0 Polyhydramnios, unspecified trimester, not applicable or unspecified: Secondary | ICD-10-CM

## 2024-01-08 DIAGNOSIS — O09899 Supervision of other high risk pregnancies, unspecified trimester: Secondary | ICD-10-CM | POA: Diagnosis not present

## 2024-01-08 DIAGNOSIS — Z98891 History of uterine scar from previous surgery: Secondary | ICD-10-CM

## 2024-01-08 DIAGNOSIS — O99210 Obesity complicating pregnancy, unspecified trimester: Secondary | ICD-10-CM

## 2024-01-08 DIAGNOSIS — O09529 Supervision of elderly multigravida, unspecified trimester: Secondary | ICD-10-CM

## 2024-01-08 NOTE — Progress Notes (Signed)
   PRENATAL VISIT NOTE  Subjective:  [name] is a 38 y.o. G3P2002 at [redacted]w[redacted]d being seen today for ongoing prenatal care.  She is currently monitored for the following issues for this high-risk pregnancy and has Extrinsic asthma; Rheumatoid arthritis (HCC); Supervision of other high risk pregnancy, antepartum; History of C-section x 2; AMA (advanced maternal age) multigravida 35+; Obesity affecting pregnancy; and Polyhydramnios affecting pregnancy on their problem list.  Patient reports occasional contractions.  Contractions: Irregular. Vag. Bleeding: None.  Movement: Present. Denies leaking of fluid.   The following portions of the patient's history were reviewed and updated as appropriate: allergies, current medications, past family history, past medical history, past social history, past surgical history and problem list.   Objective:   Vitals:   01/08/24 0859  BP: 123/66  Pulse: 99  Weight: 216 lb (98 kg)    Fetal Status: Fetal Heart Rate (bpm): 134   Movement: Present     General:  Alert, oriented and cooperative. Patient is in no acute distress.  Skin: Skin is warm and dry. No rash noted.   Cardiovascular: Normal heart rate noted  Respiratory: Normal respiratory effort, no problems with respiration noted  Abdomen: Soft, gravid, appropriate for gestational age.  Pain/Pressure: Absent     Pelvic: Cervical exam deferred        Extremities: Normal range of motion.  Edema: None  Mental Status: Normal mood and affect. Normal behavior. Normal judgment and thought content.   Assessment and Plan:  Pregnancy: G3P2002 at [redacted]w[redacted]d 1. Supervision of other high risk pregnancy, antepartum (Primary) FHT normal - Cervicovaginal ancillary only - Strep Gp B NAA  2. History of C-section x 2 Desires TOLAC Baby vertex on [place] today  3. Antepartum multigravida of advanced maternal age  59. Obesity affecting pregnancy, antepartum, unspecified obesity type  5. Rheumatoid arthritis with  positive rheumatoid factor, involving unspecified site (HCC)  6. Polyhydramnios affecting pregnancy BPP on Friday  Preterm labor symptoms and general obstetric precautions including but not limited to vaginal bleeding, contractions, leaking of fluid and fetal movement were reviewed in detail with the patient. Please refer to After Visit Summary for other counseling recommendations.   No follow-ups on file.  Future Appointments  Date Time Provider Department Center  01/09/2024  3:30 PM [facility] US4 [facility]  01/13/2024  8:35 AM Celedonio Savage, MD [facility] None  01/23/2024  1:50 PM Levie Heritage, DO [facility] None  01/29/2024  9:55 AM Levie Heritage, DO [facility] None    Levie Heritage, DO

## 2024-01-09 ENCOUNTER — Ambulatory Visit: Payer: Medicaid Other | Attending: Obstetrics | Admitting: Obstetrics

## 2024-01-09 ENCOUNTER — Ambulatory Visit: Payer: Medicaid Other | Attending: Obstetrics

## 2024-01-09 DIAGNOSIS — O09523 Supervision of elderly multigravida, third trimester: Secondary | ICD-10-CM | POA: Diagnosis not present

## 2024-01-09 DIAGNOSIS — O3509X0 Maternal care for (suspected) other central nervous system malformation or damage in fetus, not applicable or unspecified: Secondary | ICD-10-CM

## 2024-01-09 DIAGNOSIS — O409XX0 Polyhydramnios, unspecified trimester, not applicable or unspecified: Secondary | ICD-10-CM | POA: Diagnosis not present

## 2024-01-09 DIAGNOSIS — M069 Rheumatoid arthritis, unspecified: Secondary | ICD-10-CM | POA: Diagnosis not present

## 2024-01-09 DIAGNOSIS — O403XX0 Polyhydramnios, third trimester, not applicable or unspecified: Secondary | ICD-10-CM

## 2024-01-09 DIAGNOSIS — Z3A36 36 weeks gestation of pregnancy: Secondary | ICD-10-CM | POA: Diagnosis not present

## 2024-01-09 DIAGNOSIS — O34219 Maternal care for unspecified type scar from previous cesarean delivery: Secondary | ICD-10-CM | POA: Diagnosis not present

## 2024-01-09 DIAGNOSIS — Z98891 History of uterine scar from previous surgery: Secondary | ICD-10-CM

## 2024-01-09 DIAGNOSIS — O99213 Obesity complicating pregnancy, third trimester: Secondary | ICD-10-CM

## 2024-01-09 DIAGNOSIS — E669 Obesity, unspecified: Secondary | ICD-10-CM | POA: Diagnosis not present

## 2024-01-09 LAB — CERVICOVAGINAL ANCILLARY ONLY
Chlamydia: NEGATIVE
Comment: NEGATIVE
Comment: NORMAL
Neisseria Gonorrhea: NEGATIVE

## 2024-01-09 NOTE — Progress Notes (Signed)
MFM Consult Note  [name] is currently at 36 weeks and 5 days.  She has been followed due to maternal obesity and polyhydramnios.  She denies any problems since her last exam.  The overall EFW of 7 pounds 3 ounces measures at the 77th percentile for her gestational age.    Polyhydramnios with AFI of 30.1 cm continues to be noted.  Fetal movements were noted throughout today's exam.  Mild bilateral ventriculomegaly measuring 1.2 cm dilated was noted in the fetal brain.    The patient reports that a prior child was also found to have ventriculomegaly during a third trimester ultrasound. The ventriculomegaly resolved after birth and that child is doing fine today.  Therefore, she is less concerned regarding this finding.    Due to the ventriculomegaly noted in the fetal brain today, her baby should have an ultrasound of the head after birth to determine if any treatment or further workup is necessary for the ventriculomegaly.  Due to polyhydramnios and maternal obesity, delivery is recommended at around 39 weeks.  The patient reports that she will attempt a TOLAC.    The fetal position varied today from breech to vertex (probably due to polyhydramnios).  Should a TOLAC be attempted, an ultrasound should be performed to ensure that the fetus is in the vertex presentation.  No further exams were scheduled in our office.    The patient stated that all of her questions were answered today.  A total of 20 minutes was spent counseling and coordinating the care for this patient.  Greater than 50% of the time was spent in direct face-to-face contact.

## 2024-01-10 ENCOUNTER — Encounter: Payer: Self-pay | Admitting: Family Medicine

## 2024-01-10 LAB — STREP GP B NAA: Strep Gp B NAA: POSITIVE — AB

## 2024-01-13 ENCOUNTER — Ambulatory Visit (INDEPENDENT_AMBULATORY_CARE_PROVIDER_SITE_OTHER): Payer: Medicaid Other | Admitting: Family Medicine

## 2024-01-13 VITALS — BP 116/70 | HR 97 | Wt 216.0 lb

## 2024-01-13 DIAGNOSIS — O409XX0 Polyhydramnios, unspecified trimester, not applicable or unspecified: Secondary | ICD-10-CM

## 2024-01-13 DIAGNOSIS — Z98891 History of uterine scar from previous surgery: Secondary | ICD-10-CM

## 2024-01-13 DIAGNOSIS — B951 Streptococcus, group B, as the cause of diseases classified elsewhere: Secondary | ICD-10-CM

## 2024-01-13 DIAGNOSIS — Z3A37 37 weeks gestation of pregnancy: Secondary | ICD-10-CM

## 2024-01-13 DIAGNOSIS — O99210 Obesity complicating pregnancy, unspecified trimester: Secondary | ICD-10-CM

## 2024-01-13 DIAGNOSIS — O09529 Supervision of elderly multigravida, unspecified trimester: Secondary | ICD-10-CM

## 2024-01-13 DIAGNOSIS — O09899 Supervision of other high risk pregnancies, unspecified trimester: Secondary | ICD-10-CM

## 2024-01-13 NOTE — Progress Notes (Signed)
   PRENATAL VISIT NOTE  Subjective:  [name] is a 38 y.o. G3P2002 at [redacted]w[redacted]d being seen today for ongoing prenatal care.  She is currently monitored for the following issues for this high-risk pregnancy and has Extrinsic asthma; Rheumatoid arthritis (HCC); Supervision of other high risk pregnancy, antepartum; History of C-section x 2; AMA (advanced maternal age) multigravida 35+; Obesity affecting pregnancy; and Polyhydramnios affecting pregnancy on their problem list.  Patient reports no bleeding, no contractions, no cramping, and no leaking.  Contractions: Irritability. Vag. Bleeding: None.  Movement: Present. Denies leaking of fluid.   The following portions of the patient's history were reviewed and updated as appropriate: allergies, current medications, past family history, past medical history, past social history, past surgical history and problem list.   Objective:   Vitals:   01/13/24 0843  BP: 116/70  Pulse: 97  Weight: 216 lb (98 kg)    Fetal Status: Fetal Heart Rate (bpm): 143   Movement: Present     General:  Alert, oriented and cooperative. Patient is in no acute distress.  Skin: Skin is warm and dry. No rash noted.   Cardiovascular: Normal heart rate noted  Respiratory: Normal respiratory effort, no problems with respiration noted  Abdomen: Soft, gravid, appropriate for gestational age.  Pain/Pressure: Absent     Pelvic: Cervical exam deferred        Extremities: Normal range of motion.  Edema: None  Mental Status: Normal mood and affect. Normal behavior. Normal judgment and thought content.   Assessment and Plan:  Pregnancy: G3P2002 at [redacted]w[redacted]d 1. Supervision of other high risk pregnancy, antepartum (Primary) FHR and BP appropriate today Continue routine prenatal care Scheduled for induction on 2/15 for polyhydramnios per MFM recommendations  2. History of C-section x 2 Desires trial of labor Cephalic by Leopold's today  3. Antepartum multigravida of  advanced maternal age  51. Obesity affecting pregnancy, antepartum, unspecified obesity type Monitoring per MFM  5. Polyhydramnios affecting pregnancy AFI 30.17 at 1/30 ultrasound.  Recommended induction of labor at 39 weeks  6. Positive GBS test Will need antibiotics during labor  7. [redacted] weeks gestation of pregnancy   Term labor symptoms and general obstetric precautions including but not limited to vaginal bleeding, contractions, leaking of fluid and fetal movement were reviewed in detail with the patient. Please refer to After Visit Summary for other counseling recommendations.   No follow-ups on file.  Future Appointments  Date Time Provider Department Center  01/23/2024  1:50 PM Levie Heritage, DO [facility] None  01/29/2024  9:55 AM Adrian Blackwater, Rhona Raider, DO [facility] None    Celedonio Savage, MD

## 2024-01-23 ENCOUNTER — Ambulatory Visit: Payer: Medicaid Other | Admitting: Family Medicine

## 2024-01-23 VITALS — BP 116/79 | HR 133 | Wt 216.0 lb

## 2024-01-23 DIAGNOSIS — O09899 Supervision of other high risk pregnancies, unspecified trimester: Secondary | ICD-10-CM

## 2024-01-23 DIAGNOSIS — O409XX0 Polyhydramnios, unspecified trimester, not applicable or unspecified: Secondary | ICD-10-CM | POA: Diagnosis not present

## 2024-01-23 DIAGNOSIS — O09529 Supervision of elderly multigravida, unspecified trimester: Secondary | ICD-10-CM

## 2024-01-23 DIAGNOSIS — M059 Rheumatoid arthritis with rheumatoid factor, unspecified: Secondary | ICD-10-CM

## 2024-01-23 DIAGNOSIS — O99210 Obesity complicating pregnancy, unspecified trimester: Secondary | ICD-10-CM | POA: Diagnosis not present

## 2024-01-23 DIAGNOSIS — Z3A38 38 weeks gestation of pregnancy: Secondary | ICD-10-CM

## 2024-01-23 DIAGNOSIS — Z98891 History of uterine scar from previous surgery: Secondary | ICD-10-CM | POA: Diagnosis not present

## 2024-01-23 NOTE — Progress Notes (Signed)
   PRENATAL VISIT NOTE  Subjective:  JENNYLEE UEHARA is a 38 y.o. G3P2002 at [redacted]w[redacted]d being seen today for ongoing prenatal care.  She is currently monitored for the following issues for this high-risk pregnancy and has Extrinsic asthma; Rheumatoid arthritis (HCC); Supervision of other high risk pregnancy, antepartum; History of C-section x 2; AMA (advanced maternal age) multigravida 35+; Obesity affecting pregnancy; and Polyhydramnios affecting pregnancy on their problem list.  Patient reports occasional contractions.  Contractions: Irritability. Vag. Bleeding: None.  Movement: Present. Denies leaking of fluid.   The following portions of the patient's history were reviewed and updated as appropriate: allergies, current medications, past family history, past medical history, past social history, past surgical history and problem list.   Objective:   Vitals:   01/23/24 1356  BP: 116/79  Pulse: (!) 133  Weight: 216 lb (98 kg)    Fetal Status: Fetal Heart Rate (bpm): 147   Movement: Present     General:  Alert, oriented and cooperative. Patient is in no acute distress.  Skin: Skin is warm and dry. No rash noted.   Cardiovascular: Normal heart rate noted  Respiratory: Normal respiratory effort, no problems with respiration noted  Abdomen: Soft, gravid, appropriate for gestational age.  Pain/Pressure: Present     Pelvic: Cervical exam deferred        Extremities: Normal range of motion.  Edema: Trace  Mental Status: Normal mood and affect. Normal behavior. Normal judgment and thought content.   Assessment and Plan:  Pregnancy: G3P2002 at [redacted]w[redacted]d 1. [redacted] weeks gestation of pregnancy (Primary) - CBC; Standing - Type and screen; Standing - RPR; Standing  2. Supervision of other high risk pregnancy, antepartum FHT normal  3. Rheumatoid arthritis with positive rheumatoid factor, involving unspecified site (HCC)  4. History of C-section x 2 Desires TOLAC  5. Antepartum multigravida of  advanced maternal age  2. Polyhydramnios affecting pregnancy Induction at 39 weeks scheduled Low dose pitocin and foley balloon  7. Obesity affecting pregnancy, antepartum, unspecified obesity type   Term labor symptoms and general obstetric precautions including but not limited to vaginal bleeding, contractions, leaking of fluid and fetal movement were reviewed in detail with the patient. Please refer to After Visit Summary for other counseling recommendations.   No follow-ups on file.  Future Appointments  Date Time Provider Department Center  01/26/2024  6:45 AM [facility] SCHED ROOM [facility] None  03/05/2024  1:50 PM Levie Heritage, DO [facility] None    Levie Heritage, DO

## 2024-01-24 ENCOUNTER — Encounter (HOSPITAL_COMMUNITY): Payer: Self-pay | Admitting: *Deleted

## 2024-01-24 ENCOUNTER — Telehealth (HOSPITAL_COMMUNITY): Payer: Self-pay | Admitting: *Deleted

## 2024-01-24 NOTE — Progress Notes (Signed)
Error

## 2024-01-24 NOTE — Telephone Encounter (Signed)
Preadmission screen

## 2024-01-26 ENCOUNTER — Inpatient Hospital Stay (HOSPITAL_COMMUNITY): Payer: Medicaid Other

## 2024-01-26 ENCOUNTER — Encounter: Payer: Self-pay | Admitting: Obstetrics & Gynecology

## 2024-01-26 ENCOUNTER — Inpatient Hospital Stay (HOSPITAL_COMMUNITY)
Admission: RE | Admit: 2024-01-26 | Discharge: 2024-01-30 | DRG: 787 | Disposition: A | Discharge status: Home / Self Care | Payer: Medicaid Other | Attending: Obstetrics and Gynecology | Admitting: Obstetrics and Gynecology

## 2024-01-26 ENCOUNTER — Encounter (HOSPITAL_COMMUNITY): Payer: Self-pay | Admitting: Family Medicine

## 2024-01-26 DIAGNOSIS — Z98891 History of uterine scar from previous surgery: Secondary | ICD-10-CM

## 2024-01-26 DIAGNOSIS — O34211 Maternal care for low transverse scar from previous cesarean delivery: Secondary | ICD-10-CM | POA: Diagnosis not present

## 2024-01-26 DIAGNOSIS — M069 Rheumatoid arthritis, unspecified: Secondary | ICD-10-CM | POA: Diagnosis present

## 2024-01-26 DIAGNOSIS — O3509X0 Maternal care for (suspected) other central nervous system malformation or damage in fetus, not applicable or unspecified: Secondary | ICD-10-CM | POA: Diagnosis present

## 2024-01-26 DIAGNOSIS — O99214 Obesity complicating childbirth: Secondary | ICD-10-CM | POA: Diagnosis not present

## 2024-01-26 DIAGNOSIS — O99824 Streptococcus B carrier state complicating childbirth: Secondary | ICD-10-CM | POA: Diagnosis present

## 2024-01-26 DIAGNOSIS — O99210 Obesity complicating pregnancy, unspecified trimester: Secondary | ICD-10-CM | POA: Diagnosis present

## 2024-01-26 DIAGNOSIS — E66813 Obesity, class 3: Secondary | ICD-10-CM | POA: Diagnosis present

## 2024-01-26 DIAGNOSIS — O403XX0 Polyhydramnios, third trimester, not applicable or unspecified: Secondary | ICD-10-CM | POA: Diagnosis not present

## 2024-01-26 DIAGNOSIS — O99820 Streptococcus B carrier state complicating pregnancy: Secondary | ICD-10-CM | POA: Diagnosis not present

## 2024-01-26 DIAGNOSIS — Z3A39 39 weeks gestation of pregnancy: Secondary | ICD-10-CM | POA: Diagnosis not present

## 2024-01-26 DIAGNOSIS — O09523 Supervision of elderly multigravida, third trimester: Secondary | ICD-10-CM | POA: Diagnosis not present

## 2024-01-26 DIAGNOSIS — O9081 Anemia of the puerperium: Secondary | ICD-10-CM | POA: Diagnosis not present

## 2024-01-26 DIAGNOSIS — Z3A38 38 weeks gestation of pregnancy: Secondary | ICD-10-CM

## 2024-01-26 DIAGNOSIS — O409XX0 Polyhydramnios, unspecified trimester, not applicable or unspecified: Secondary | ICD-10-CM | POA: Diagnosis present

## 2024-01-26 DIAGNOSIS — O09899 Supervision of other high risk pregnancies, unspecified trimester: Principal | ICD-10-CM

## 2024-01-26 DIAGNOSIS — D62 Acute posthemorrhagic anemia: Secondary | ICD-10-CM | POA: Diagnosis not present

## 2024-01-26 DIAGNOSIS — O09529 Supervision of elderly multigravida, unspecified trimester: Secondary | ICD-10-CM

## 2024-01-26 DIAGNOSIS — Z3490 Encounter for supervision of normal pregnancy, unspecified, unspecified trimester: Secondary | ICD-10-CM | POA: Diagnosis present

## 2024-01-26 HISTORY — DX: Rheumatoid arthritis, unspecified: M06.9

## 2024-01-26 LAB — CBC
HCT: 40 % (ref 36.0–46.0)
Hemoglobin: 13.3 g/dL (ref 12.0–15.0)
MCH: 26.5 pg (ref 26.0–34.0)
MCHC: 33.3 g/dL (ref 30.0–36.0)
MCV: 79.7 fL — ABNORMAL LOW (ref 80.0–100.0)
Platelets: 276 10*3/uL (ref 150–400)
RBC: 5.02 MIL/uL (ref 3.87–5.11)
RDW: 15 % (ref 11.5–15.5)
WBC: 7.7 10*3/uL (ref 4.0–10.5)
nRBC: 0.3 % — ABNORMAL HIGH (ref 0.0–0.2)

## 2024-01-26 LAB — TYPE AND SCREEN
ABO/RH(D): O POS
Antibody Screen: NEGATIVE

## 2024-01-26 MED ORDER — OXYCODONE-ACETAMINOPHEN 5-325 MG PO TABS: 1.0000 | ORAL_TABLET | ORAL | Status: DC | PRN

## 2024-01-26 MED ORDER — LACTATED RINGERS IV SOLN
500.0000 mL | INTRAVENOUS | Status: DC | PRN
  Administered 2024-01-27: 500 mL via INTRAVENOUS
  Administered 2024-01-27: 1000 mL via INTRAVENOUS

## 2024-01-26 MED ORDER — ACETAMINOPHEN 325 MG PO TABS: 650.0000 mg | ORAL_TABLET | ORAL | Status: DC | PRN

## 2024-01-26 MED ORDER — ONDANSETRON HCL 4 MG/2ML IJ SOLN: 4.0000 mg | Freq: Four times a day (QID) | INTRAMUSCULAR | Status: DC | PRN

## 2024-01-26 MED ORDER — LACTATED RINGERS IV SOLN: INTRAVENOUS | Status: DC

## 2024-01-26 MED ORDER — TERBUTALINE SULFATE 1 MG/ML IJ SOLN: 0.2500 mg | Freq: Once | INTRAMUSCULAR | Status: DC | PRN

## 2024-01-26 MED ORDER — OXYCODONE-ACETAMINOPHEN 5-325 MG PO TABS: 2.0000 | ORAL_TABLET | ORAL | Status: DC | PRN

## 2024-01-26 MED ORDER — SOD CITRATE-CITRIC ACID 500-334 MG/5ML PO SOLN: 30.0000 mL | ORAL | Status: DC | PRN

## 2024-01-26 MED ORDER — OXYTOCIN-SODIUM CHLORIDE 30-0.9 UT/500ML-% IV SOLN
1.0000 m[IU]/min | INTRAVENOUS | Status: DC
  Filled 2024-01-26: qty 500

## 2024-01-26 MED ORDER — PENICILLIN G POT IN DEXTROSE 60000 UNIT/ML IV SOLN
3.0000 10*6.[IU] | INTRAVENOUS | Status: DC
  Administered 2024-01-26 – 2024-01-28 (×8): 3 10*6.[IU] via INTRAVENOUS
  Filled 2024-01-26 (×9): qty 50

## 2024-01-26 MED ORDER — LIDOCAINE HCL (PF) 1 % IJ SOLN: 30.0000 mL | INTRAMUSCULAR | Status: DC | PRN

## 2024-01-26 MED ORDER — OXYTOCIN BOLUS FROM INFUSION: 333.0000 mL | Freq: Once | INTRAVENOUS | Status: DC

## 2024-01-26 MED ORDER — OXYTOCIN-SODIUM CHLORIDE 30-0.9 UT/500ML-% IV SOLN
2.5000 [IU]/h | INTRAVENOUS | Status: DC
Start: 2024-01-26 — End: 2024-01-28

## 2024-01-26 MED ORDER — SODIUM CHLORIDE 0.9 % IV SOLN
5.0000 10*6.[IU] | Freq: Once | INTRAVENOUS | Status: AC
  Administered 2024-01-26: 5 10*6.[IU] via INTRAVENOUS
  Filled 2024-01-26: qty 5

## 2024-01-26 NOTE — Progress Notes (Signed)
Pt admitted for IOL due to polyhydramnios.  While pt has been counseled in office, planned to review inform consent.   We discussed her history of c-section. Her previous c-section was due to  non-reassuring fetal heart tones for her first C-section then 2nd C-section due to arrest of dilation and NRFWB.   She has a history of  no prior successful vaginal deliveries  - We discussed the risks associated with TOLAC specifically focusing on the risk of uterine rupture. We discussed with the risk of uterine rupture that while rare it is not easily predicted, that it is a surgical emergency, and it can be potentially catastrophic for mom and baby.  -Discussed higher chance of failure due to induction of labor. -Discussed management of IOL and reviewed with patient that should she note any constant pain, bleeding or other acute concern please notify RN immediately. - After counseling, the patient was given the opportunity to ask questions and all questions answered.  - After considering her options, she would like to [facility]  -Reviewed contraceptive plan- declined permanent sterilization.  Considering Mirena IUD  FWB: Cat. I Foley currently in place Pt desires one method of induction at a time.  Following Foley will either consider Pit and/or AROM.  Myna Hidalgo, DO Attending Obstetrician & Gynecologist, [facility] for [employer], [facility]

## 2024-01-26 NOTE — Progress Notes (Addendum)
Labor Progress Note [name] is a 38 y.o. G3P2002 at [redacted]w[redacted]d presented for IOL due to polyhydramnios with hx LTCS x 2.  S: Patient laying comfortably in bed, children at bedside. Patient would like to continue one method of induction at a time for now however reports understanding if TOLAC unsuccessful and needing cesarean. Would like to continue TOLAC for now and see if progression.    O: Introductions to Dr. Leanora Cover and Dr. De Burrs as night team. Foley in place. BP 124/82 (BP Location: Left Arm)   Pulse (!) 103   Temp 98.1 F (36.7 C) (Oral)   Resp 18   Ht 4\' 9"  (1.448 m)   Wt 98.3 kg   LMP 04/27/2023 (Exact Date)   BMI 46.92 kg/m  EFM: baseline 145/moderate variability/+ accel/- decel  CVE: Dilation: Fingertip Effacement (%): Thick Cervical Position: Middle Station: Ballotable Presentation: Vertex Exam by:: Marcelle Overlie RN   A&P: 38 y.o. [id number] [redacted]w[redacted]d IOL due to polyhydramnios. Hx LTCS x2.  #Labor: TOLAC. Planning for one method of induction at time with foley currently in place. Next check when foley comes out. #Pain: IV pain meds, epidural PRN for active labor #FWB: Cat I #GBS positive   Katie L. De Burrs, MD Family Medicine, PGY-1 10:02 PM   GME ATTESTATION:  Evaluation and management procedures were performed by the [facility] Medicine Resident under my supervision. I was immediately available for direct supervision, assistance and direction throughout this encounter.  I also confirm that I have verified the information documented in the resident's note, and that I have also personally reperformed the pertinent components of the physical exam and all of the medical decision making activities.  I have also made any necessary editorial changes.  [name], MD OB Fellow, Faculty [facility], [facility] 01/26/2024 11:06 PM

## 2024-01-26 NOTE — H&P (Cosign Needed Addendum)
[name] is a 38 y.o. G32P2002 female at [redacted]w[redacted]d by [place] at 10 weeks presenting for IOL polyhydramnios AFI 30.1 and TOLAC x2.  Reports active fetal movement, contractions: irregular, vaginal bleeding: none, membranes: intact.  Initiated prenatal care at [facility] at 10 wks.   Most recent u/s 01/09/24.    Prenatal History/Complications:  LTCS x2  -G1: fetal distress -G2: Pt admitted in active labor and only progressed to 5 cm despite pitocin augmentation.  VBAC consent signed   Patient Active Problem List   Diagnosis Date Noted   Pregnancy complicated by mild fetal ventriculomegaly 01/26/2024   Encounter for induction of labor 01/26/2024   Polyhydramnios affecting pregnancy 10/28/2023   Obesity affecting pregnancy 09/03/2023   Supervision of other high risk pregnancy, antepartum 07/08/2023   History of C-section x 2 07/08/2023   AMA (advanced maternal age) multigravida 35+ 07/08/2023   Rheumatoid arthritis (HCC) 01/23/2022   Extrinsic asthma 08/09/2010     Past Medical History: Past Medical History:  Diagnosis Date   Asthma    Iron deficiency anemia due to chronic blood loss 05/15/2018   Rheumatoid arthritis (HCC)    Urticaria    Urticaria 04/21/2018   History of recurrent urticaria    Past Surgical History: Past Surgical History:  Procedure Laterality Date   CESAREAN SECTION  2009   CESAREAN SECTION  08/21/2012   Procedure: CESAREAN SECTION;  Surgeon: Loney Laurence, MD;  Location: [facility];  Service: Obstetrics;  Laterality: N/A;    Obstetrical History: OB History     Gravida  3   Para  2   Term  2   Preterm      AB      Living  2      SAB      IAB      Ectopic      Multiple      Live Births  2           Social History: Social History   Socioeconomic History   Marital status: Married    Spouse name: Not on file   Number of children: Not on file   Years of education: Not on file   Highest education level: Not on file   Occupational History   Occupation: nurse    Employer: [employer]  Tobacco Use   Smoking status: Never   Smokeless tobacco: Never  Vaping Use   Vaping status: Never Used  Substance and Sexual Activity   Alcohol use: Yes    Comment: socially 2 per week   Drug use: No   Sexual activity: Yes    Partners: Male  Other Topics Concern   Not on file  Social History Narrative   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     Social Drivers of Corporate investment banker Strain: Low Risk  (03/06/2018)   Overall Financial Resource Strain (CARDIA)    Difficulty of Paying Living Expenses: Not hard at all  Food Insecurity: No Food Insecurity (01/26/2024)   Hunger Vital Sign    Worried About Running Out of Food in the Last Year: Never true    Ran Out of Food in the Last Year: Never true  Transportation Needs: No Transportation Needs (01/26/2024)   PRAPARE - Transportation    Lack of  Transportation (Medical): No    Lack of Transportation (Non-Medical): No  Physical Activity: Unknown (03/06/2018)   Exercise Vital Sign    Days of Exercise per Week: 0 days    Minutes of Exercise per Session: Not on file  Stress: No Stress Concern Present (03/06/2018)   [employer] of Occupational Health - Occupational Stress Questionnaire    Feeling of Stress : Only a little  Social Connections: Moderately Integrated (01/26/2024)   Social Connection and Isolation Panel [NHANES]    Frequency of Communication with Friends and Family: Three times a week    Frequency of Social Gatherings with Friends and Family: Three times a week    Attends Religious Services: More than 4 times per year    Active Member of Clubs or Organizations: No    Attends Banker Meetings: Never    Marital Status: Married    Family History: Family History  Problem Relation Age of  Onset   Fibromyalgia Mother    Deep vein thrombosis Mother        recurrent dvt   Cancer Father        unsure   Migraines Sister    Healthy Daughter    Healthy Son    Eczema Maternal Uncle    Other Neg Hx     Allergies: No Known Allergies  Medications Prior to Admission  Medication Sig Dispense Refill Last Dose/Taking   aspirin EC 81 MG tablet Take 1 tablet (81 mg total) by mouth daily. Start taking when you are [redacted] weeks pregnant for rest of pregnancy for prevention of preeclampsia 90 tablet 2    Magnesium 250 MG TABS Take by mouth.      Prenatal Vit-Fe Fumarate-FA (MULTIVITAMIN-PRENATAL) 27-0.8 MG TABS tablet Take 1 tablet by mouth daily at 12 noon.      VENTOLIN HFA 108 (90 Base) MCG/ACT inhaler INHALE 2 PUFFS INTO THE LUNGS EVERY 6 HOURS AS NEEDED FOR WHEEZING OR SHORTNESS OF BREATH 18 g 0     Review of Systems  Pertinent pos/neg as indicated in HPI  Blood pressure 114/66, pulse (!) 117, temperature 98.1 F (36.7 C), temperature source Oral, resp. rate 18, height 4\' 9"  (1.448 m), weight 98.3 kg, last menstrual period 04/27/2023, unknown if currently breastfeeding. General appearance: alert and cooperative Lungs: clear to auscultation bilaterally Heart: regular rate and rhythm Abdomen: gravid, soft, non-tender Extremities: none edema   Fetal monitoring: FHR: 145-150 bpm, variability: moderate,  Accelerations: Present,  decelerations:  Absent Uterine activity: Irregular Dilation: Fingertip Effacement (%): Thick Station: Ballotable Exam by:: Marcelle Overlie RN Presentation: cephalic   Prenatal labs: ABO, Rh: --/--/O POS (02/16 1647) Antibody: NEG (02/16 1647) Rubella: 3.79 (07/29 1030) RPR: Non Reactive (11/18 1038)  HBsAg: Negative (07/29 1030)  HIV: Non Reactive (11/18 1038)  GBS: Positive/-- (01/29 0857)  2hr GTT: 87  Prenatal Transfer Tool  Maternal Diabetes: No Genetic Screening: Declined Maternal Ultrasounds/Referrals: Normal Fetal Ultrasounds or other  Referrals:  Referred to Materal Fetal Medicine  Maternal Substance Abuse:  No Significant Maternal Medications:  None Significant Maternal Lab Results: Group B Strep positive  Results for orders placed or performed during the hospital encounter of 01/26/24 (from the past 24 hours)  Type and screen   Collection Time: 01/26/24  4:47 PM  Result Value Ref Range   ABO/RH(D) O POS    Antibody Screen NEG    Sample Expiration      01/29/2024,2359 Performed at [facility], [address], [place],  [place] [address]   CBC   Collection Time: 01/26/24  4:49 PM  Result Value Ref Range   WBC 7.7 4.0 - 10.5 K/uL   RBC 5.02 3.87 - 5.11 MIL/uL   Hemoglobin 13.3 12.0 - 15.0 g/dL   HCT 47.4 25.9 - 56.3 %   MCV 79.7 (L) 80.0 - 100.0 fL   MCH 26.5 26.0 - 34.0 pg   MCHC 33.3 30.0 - 36.0 g/dL   RDW 87.5 64.3 - 32.9 %   Platelets 276 150 - 400 K/uL   nRBC 0.3 (H) 0.0 - 0.2 %     Assessment:  [redacted]w[redacted]d SIUP  G3P2002  IOL Polyhydramnios   LTCS x2  Cat 1 FHR  GBS Positive/-- (01/29 0857)  Plan:  Admit to L&D  IV pain meds/epidural prn active labor  TOLAC  Foley balloon for IOL, pitocin prn pt wanting to use foley balloon first and see how body and baby reacts  Anticipate VBAC   Plans to breast feed  Contraception: Undecided possible BTL  Circumcision: yes  Herminio Commons [facility] 01/26/2024, 6:21 PM   I performed the exam and foley bulb placement and agree with above.  Katrinka Blazing, [place] 01/27/2024 8:10 PM

## 2024-01-27 ENCOUNTER — Inpatient Hospital Stay (HOSPITAL_COMMUNITY): Payer: Medicaid Other | Admitting: Anesthesiology

## 2024-01-27 DIAGNOSIS — Z3A39 39 weeks gestation of pregnancy: Secondary | ICD-10-CM | POA: Diagnosis not present

## 2024-01-27 DIAGNOSIS — O34211 Maternal care for low transverse scar from previous cesarean delivery: Secondary | ICD-10-CM | POA: Diagnosis not present

## 2024-01-27 LAB — RPR: RPR Ser Ql: NONREACTIVE

## 2024-01-27 MED ORDER — EPHEDRINE 5 MG/ML INJ
10.0000 mg | INTRAVENOUS | Status: DC | PRN
  Administered 2024-01-27: 10 mg via INTRAVENOUS
  Filled 2024-01-27: qty 5

## 2024-01-27 MED ORDER — DIPHENHYDRAMINE HCL 50 MG/ML IJ SOLN: 12.5000 mg | INTRAMUSCULAR | Status: DC | PRN

## 2024-01-27 MED ORDER — TERBUTALINE SULFATE 1 MG/ML IJ SOLN: 0.2500 mg | Freq: Once | INTRAMUSCULAR | Status: DC | PRN

## 2024-01-27 MED ORDER — PHENYLEPHRINE 80 MCG/ML (10ML) SYRINGE FOR IV PUSH (FOR BLOOD PRESSURE SUPPORT)
80.0000 ug | PREFILLED_SYRINGE | INTRAVENOUS | Status: AC | PRN
  Administered 2024-01-27: 160 ug via INTRAVENOUS
  Administered 2024-01-27: 80 ug via INTRAVENOUS

## 2024-01-27 MED ORDER — LACTATED RINGERS IV SOLN: 500.0000 mL | Freq: Once | INTRAVENOUS | Status: DC

## 2024-01-27 MED ORDER — OXYTOCIN-SODIUM CHLORIDE 30-0.9 UT/500ML-% IV SOLN
1.0000 m[IU]/min | INTRAVENOUS | Status: DC
  Administered 2024-01-27: 2 m[IU]/min via INTRAVENOUS

## 2024-01-27 MED ORDER — FENTANYL-BUPIVACAINE-NACL 0.5-0.125-0.9 MG/250ML-% EP SOLN
12.0000 mL/h | EPIDURAL | Status: DC | PRN
  Administered 2024-01-27: 12 mL/h via EPIDURAL
  Filled 2024-01-27: qty 250

## 2024-01-27 MED ORDER — PHENYLEPHRINE 80 MCG/ML (10ML) SYRINGE FOR IV PUSH (FOR BLOOD PRESSURE SUPPORT)
80.0000 ug | PREFILLED_SYRINGE | INTRAVENOUS | Status: DC | PRN
  Administered 2024-01-27: 80 ug via INTRAVENOUS
  Filled 2024-01-27: qty 10

## 2024-01-27 MED ORDER — EPHEDRINE 5 MG/ML INJ: 10.0000 mg | INTRAVENOUS | Status: DC | PRN

## 2024-01-27 MED ORDER — LIDOCAINE-EPINEPHRINE (PF) 2 %-1:200000 IJ SOLN: INTRAMUSCULAR | Status: DC | PRN

## 2024-01-27 MED ORDER — DIPHENHYDRAMINE HCL 50 MG/ML IJ SOLN
25.0000 mg | Freq: Once | INTRAMUSCULAR | Status: DC
  Filled 2024-01-27: qty 1

## 2024-01-27 MED ORDER — LIDOCAINE HCL (PF) 1 % IJ SOLN
INTRAMUSCULAR | Status: DC | PRN
  Administered 2024-01-27: 10 mL via EPIDURAL

## 2024-01-27 NOTE — Anesthesia Procedure Notes (Signed)
Epidural Patient location during procedure: OB Start time: 01/27/2024 5:20 PM End time: 01/27/2024 5:30 PM  Staffing Anesthesiologist: Elmer Picker, MD Performed: anesthesiologist   Preanesthetic Checklist Completed: patient identified, IV checked, risks and benefits discussed, monitors and equipment checked, pre-op evaluation and timeout performed  Epidural Patient position: sitting Prep: DuraPrep and site prepped and draped Patient monitoring: continuous pulse ox, blood pressure, heart rate and cardiac monitor Approach: midline Location: L3-L4 Injection technique: LOR air  Needle:  Needle type: Tuohy  Needle gauge: 17 G Needle length: 9 cm Needle insertion depth: 7 cm Catheter type: closed end flexible Catheter size: 19 Gauge Catheter at skin depth: 12 cm Test dose: negative  Assessment Sensory level: T8 Events: blood not aspirated, no cerebrospinal fluid, injection not painful, no injection resistance, no paresthesia and negative IV test  Additional Notes Patient identified. Risks/Benefits/Options discussed with patient including but not limited to bleeding, infection, nerve damage, paralysis, failed block, incomplete pain control, headache, blood pressure changes, nausea, vomiting, reactions to medication both or allergic, itching and postpartum back pain. Confirmed with bedside nurse the patient's most recent platelet count. Confirmed with patient that they are not currently taking any anticoagulation, have any bleeding history or any family history of bleeding disorders. Patient expressed understanding and wished to proceed. All questions were answered. Sterile technique was used throughout the entire procedure. Please see nursing notes for vital signs. Test dose was given through epidural catheter and negative prior to continuing to dose epidural or start infusion. Warning signs of high block given to the patient including shortness of breath, tingling/numbness in hands,  complete motor block, or any concerning symptoms with instructions to call for help. Patient was given instructions on fall risk and not to get out of bed. All questions and concerns addressed with instructions to call with any issues or inadequate analgesia.  Reason for block:procedure for pain

## 2024-01-27 NOTE — Anesthesia Preprocedure Evaluation (Addendum)
Anesthesia Evaluation  Patient identified by MRN, date of birth, ID band Patient awake    Reviewed: Allergy & Precautions, NPO status , Patient's Chart, lab work & pertinent test results  Airway Mallampati: III  TM Distance: >3 FB Neck ROM: Full    Dental no notable dental hx.    Pulmonary asthma    Pulmonary exam normal breath sounds clear to auscultation       Cardiovascular negative cardio ROS Normal cardiovascular exam Rhythm:Regular Rate:Normal     Neuro/Psych negative neurological ROS  negative psych ROS   GI/Hepatic negative GI ROS, Neg liver ROS,,,  Endo/Other    Class 3 obesity (BMI 47)  Renal/GU negative Renal ROS  negative genitourinary   Musculoskeletal  (+) Arthritis , Rheumatoid disorders,    Abdominal   Peds  Hematology negative hematology ROS (+)   Anesthesia Other Findings TOLAC, h/o C/Sx2, IOL for poly  Reproductive/Obstetrics (+) Pregnancy                             Anesthesia Physical Anesthesia Plan  ASA: 3  Anesthesia Plan: Epidural   Post-op Pain Management: Minimal or no pain anticipated   Induction: Intravenous  PONV Risk Score and Plan: 4 or greater and Treatment may vary due to age or medical condition and Scopolamine patch - Pre-op  Airway Management Planned: Natural Airway  Additional Equipment: Fetal Monitoring and None  Intra-op Plan:   Post-operative Plan:   Informed Consent: I have reviewed the patients History and Physical, chart, labs and discussed the procedure including the risks, benefits and alternatives for the proposed anesthesia with the patient or authorized representative who has indicated his/her understanding and acceptance.     Dental advisory given  Plan Discussed with: Anesthesiologist and CRNA  Anesthesia Plan Comments: (Patient identified. Risks, benefits, options discussed with patient including but not limited to  bleeding, infection, nerve damage, paralysis, failed block, incomplete pain control, headache, blood pressure changes, nausea, vomiting, reactions to medication, itching, and post partum back pain. Confirmed with bedside nurse the patient's most recent platelet count. Confirmed with the patient that they are not taking any anticoagulation, have any bleeding history or any family history of bleeding disorders. Patient expressed understanding and wishes to proceed. All questions were answered.   Patient for C/Section for failed induction and Hx/o 2 previous C/Sections. Will use epidural for C/Section. M. Malen Gauze, MD)       Anesthesia Quick Evaluation

## 2024-01-27 NOTE — Progress Notes (Signed)
Labor Progress Note [name] is a 38 y.o. G3P2002 at [redacted]w[redacted]d presented for IOL due to polyhydramnios S: More comfortable now that balloon is out. Would like to start pitocin, augmentation.    O:  BP 124/82 (BP Location: Left Arm)   Pulse (!) 103   Temp 98.1 F (36.7 C) (Oral)   Resp 18   Ht 4\' 9"  (1.448 m)   Wt 98.3 kg   LMP 04/27/2023 (Exact Date)   BMI 46.92 kg/m   EFM: baseline 130, accels, no decels, moderate variability TOCO: q6-34min contractions  CVE: Dilation: 4 Effacement (%): 30 Cervical Position: Middle Station: -3 Presentation: Vertex Exam by:: Yides Saidi MD   A&P: 38 y.o. [id number] [redacted]w[redacted]d admitted for IOL #Labor: Progressing well. FB out. Start pitocin 2x2. AROM if/when able.  #Pain: Mild #FWB: Cat I #GBS positive, PCN   [name], MD 3:35 AM

## 2024-01-27 NOTE — Progress Notes (Signed)
LABOR PROGRESS NOTE  Patient Name: [name], female   DOB: Apr 09, 1986, 38 y.o.  MRN: [id number]  [id number] at [redacted]w[redacted]d admitted for IOL 2/2 poly  Patient amenable to AROM at this time and IUPC placement. R/B/A of AROM, IUPC discussed with patient, and verbal consent obtained. Babe's head found to be well-applied. Obtained moderate clear fluid with bloody show. Placed IUPC without difficulty. Mom and babe tolerated well. Cat I tracing.  [name], MD FMOB Fellow, Faculty practice [facility], Center for [facility] 01/27/24  8:07 PM

## 2024-01-27 NOTE — Progress Notes (Signed)
LABOR PROGRESS NOTE  Patient Name: [name], female   DOB: 1986/08/05, 38 y.o.  MRN: [id number]  Patient contractions much more. Breathing through them. Still 2-5 minutes apart. Amenable to check. Cervix 4.50/60/-3. Head much more engaged than prior and cervix much less posterior. Patient plans to get epidural, then AROM.  Cat I. 140/mod v/+accels/no decels  [name], MD

## 2024-01-27 NOTE — Progress Notes (Signed)
LABOR PROGRESS NOTE  Patient Name: [name], female   DOB: [birth year], 38 y.o.  MRN: [id number]  To bedside to meet patient. She is feeling rare contractions, 2/10. Cervix without significant change from 0309 when FB out. Discussed R/B/A to AROM. She would like to go up on pitocin, get epidural, then plan for AROM. Plan discussed with Dr. Adrian Blackwater who was also at the bedside. Cat I.  [name], MD

## 2024-01-28 ENCOUNTER — Encounter (HOSPITAL_COMMUNITY): Payer: Self-pay | Admitting: Family Medicine

## 2024-01-28 ENCOUNTER — Encounter: Payer: Self-pay | Admitting: Family

## 2024-01-28 ENCOUNTER — Encounter (HOSPITAL_COMMUNITY)
Admission: RE | Disposition: A | Discharge status: Home / Self Care | Payer: Self-pay | Source: Home / Self Care | Attending: Obstetrics and Gynecology

## 2024-01-28 ENCOUNTER — Other Ambulatory Visit: Payer: Self-pay

## 2024-01-28 DIAGNOSIS — O34211 Maternal care for low transverse scar from previous cesarean delivery: Secondary | ICD-10-CM | POA: Diagnosis not present

## 2024-01-28 DIAGNOSIS — O09523 Supervision of elderly multigravida, third trimester: Secondary | ICD-10-CM | POA: Diagnosis not present

## 2024-01-28 DIAGNOSIS — O99214 Obesity complicating childbirth: Secondary | ICD-10-CM | POA: Diagnosis not present

## 2024-01-28 DIAGNOSIS — O99820 Streptococcus B carrier state complicating pregnancy: Secondary | ICD-10-CM | POA: Diagnosis not present

## 2024-01-28 DIAGNOSIS — O403XX0 Polyhydramnios, third trimester, not applicable or unspecified: Secondary | ICD-10-CM | POA: Diagnosis not present

## 2024-01-28 DIAGNOSIS — O43893 Other placental disorders, third trimester: Secondary | ICD-10-CM | POA: Diagnosis not present

## 2024-01-28 DIAGNOSIS — Z3A39 39 weeks gestation of pregnancy: Secondary | ICD-10-CM | POA: Diagnosis not present

## 2024-01-28 SURGERY — Surgical Case
Anesthesia: Epidural

## 2024-01-28 MED ORDER — MENTHOL 3 MG MT LOZG: 1.0000 | LOZENGE | OROMUCOSAL | Status: DC | PRN

## 2024-01-28 MED ORDER — SODIUM CHLORIDE 0.9 % IV SOLN
INTRAVENOUS | Status: AC
  Filled 2024-01-28: qty 5

## 2024-01-28 MED ORDER — LIDOCAINE-EPINEPHRINE (PF) 2 %-1:200000 IJ SOLN
INTRAMUSCULAR | Status: DC | PRN
  Administered 2024-01-28 (×2): 2 mL via EPIDURAL

## 2024-01-28 MED ORDER — CEFAZOLIN SODIUM-DEXTROSE 2-3 GM-%(50ML) IV SOLR
INTRAVENOUS | Status: DC | PRN
Start: 2024-01-28 — End: 2024-01-28
  Administered 2024-01-28: 2 g via INTRAVENOUS

## 2024-01-28 MED ORDER — WITCH HAZEL-GLYCERIN EX PADS: 1.0000 | MEDICATED_PAD | CUTANEOUS | Status: DC | PRN

## 2024-01-28 MED ORDER — DEXMEDETOMIDINE HCL IN NACL 80 MCG/20ML IV SOLN
INTRAVENOUS | Status: AC
  Filled 2024-01-28: qty 20

## 2024-01-28 MED ORDER — CALCIUM CARBONATE ANTACID 500 MG PO CHEW
2.0000 | CHEWABLE_TABLET | Freq: Once | ORAL | Status: AC
  Administered 2024-01-28: 400 mg via ORAL
  Filled 2024-01-28: qty 2

## 2024-01-28 MED ORDER — NALOXONE HCL 0.4 MG/ML IJ SOLN: 0.4000 mg | INTRAMUSCULAR | Status: DC | PRN

## 2024-01-28 MED ORDER — MEPERIDINE HCL 25 MG/ML IJ SOLN: 6.2500 mg | INTRAMUSCULAR | Status: DC | PRN

## 2024-01-28 MED ORDER — SODIUM CHLORIDE 0.9% FLUSH: 3.0000 mL | INTRAVENOUS | Status: DC | PRN

## 2024-01-28 MED ORDER — MORPHINE SULFATE (PF) 0.5 MG/ML IJ SOLN
INTRAMUSCULAR | Status: AC
  Filled 2024-01-28: qty 10

## 2024-01-28 MED ORDER — LIDOCAINE-EPINEPHRINE (PF) 2 %-1:200000 IJ SOLN
INTRAMUSCULAR | Status: DC | PRN
  Administered 2024-01-28: 2 mL via INTRADERMAL
  Administered 2024-01-28: 4 mL via INTRADERMAL

## 2024-01-28 MED ORDER — TRANEXAMIC ACID-NACL 1000-0.7 MG/100ML-% IV SOLN
INTRAVENOUS | Status: DC | PRN
  Administered 2024-01-28: 1000 mg via INTRAVENOUS

## 2024-01-28 MED ORDER — OXYTOCIN-SODIUM CHLORIDE 30-0.9 UT/500ML-% IV SOLN
INTRAVENOUS | Status: AC
  Filled 2024-01-28: qty 500

## 2024-01-28 MED ORDER — ACETAMINOPHEN 500 MG PO TABS
1000.0000 mg | ORAL_TABLET | Freq: Four times a day (QID) | ORAL | Status: DC
  Administered 2024-01-28: 1000 mg via ORAL
  Administered 2024-01-29: 500 mg via ORAL
  Administered 2024-01-29 (×2): 1000 mg via ORAL
  Administered 2024-01-29: 500 mg via ORAL
  Administered 2024-01-29 – 2024-01-30 (×4): 1000 mg via ORAL
  Filled 2024-01-28 (×8): qty 2

## 2024-01-28 MED ORDER — SENNOSIDES-DOCUSATE SODIUM 8.6-50 MG PO TABS
2.0000 | ORAL_TABLET | Freq: Every day | ORAL | Status: DC
  Administered 2024-01-29 – 2024-01-30 (×2): 2 via ORAL
  Filled 2024-01-28 (×2): qty 2

## 2024-01-28 MED ORDER — FENTANYL CITRATE (PF) 100 MCG/2ML IJ SOLN
INTRAMUSCULAR | Status: AC
  Filled 2024-01-28: qty 2

## 2024-01-28 MED ORDER — KETOROLAC TROMETHAMINE 30 MG/ML IJ SOLN: 30.0000 mg | Freq: Four times a day (QID) | INTRAMUSCULAR | Status: AC | PRN

## 2024-01-28 MED ORDER — ACETAMINOPHEN 10 MG/ML IV SOLN
INTRAVENOUS | Status: DC | PRN
  Administered 2024-01-28: 1000 mg via INTRAVENOUS

## 2024-01-28 MED ORDER — ONDANSETRON HCL 4 MG/2ML IJ SOLN: 4.0000 mg | Freq: Three times a day (TID) | INTRAMUSCULAR | Status: DC | PRN

## 2024-01-28 MED ORDER — DEXAMETHASONE SODIUM PHOSPHATE 4 MG/ML IJ SOLN
INTRAMUSCULAR | Status: DC | PRN
Start: 2024-01-28 — End: 2024-01-28
  Administered 2024-01-28: 10 mg via INTRAVENOUS

## 2024-01-28 MED ORDER — ACETAMINOPHEN 10 MG/ML IV SOLN
INTRAVENOUS | Status: AC
  Filled 2024-01-28: qty 100

## 2024-01-28 MED ORDER — OXYCODONE HCL 5 MG PO TABS
5.0000 mg | ORAL_TABLET | ORAL | Status: DC | PRN
  Administered 2024-01-29 (×2): 5 mg via ORAL
  Administered 2024-01-30: 10 mg via ORAL
  Filled 2024-01-28: qty 1
  Filled 2024-01-28: qty 2
  Filled 2024-01-28: qty 1

## 2024-01-28 MED ORDER — HYDROXYZINE HCL 25 MG PO TABS
25.0000 mg | ORAL_TABLET | Freq: Three times a day (TID) | ORAL | Status: DC | PRN
  Administered 2024-01-28 – 2024-01-29 (×3): 25 mg via ORAL
  Filled 2024-01-28 (×3): qty 1

## 2024-01-28 MED ORDER — FENTANYL CITRATE (PF) 100 MCG/2ML IJ SOLN: 25.0000 ug | INTRAMUSCULAR | Status: DC | PRN

## 2024-01-28 MED ORDER — SIMETHICONE 80 MG PO CHEW
80.0000 mg | CHEWABLE_TABLET | Freq: Three times a day (TID) | ORAL | Status: DC
  Administered 2024-01-28 – 2024-01-30 (×7): 80 mg via ORAL
  Filled 2024-01-28 (×7): qty 1

## 2024-01-28 MED ORDER — ENOXAPARIN SODIUM 40 MG/0.4ML IJ SOSY
40.0000 mg | PREFILLED_SYRINGE | INTRAMUSCULAR | Status: DC
  Administered 2024-01-29 – 2024-01-30 (×2): 40 mg via SUBCUTANEOUS
  Filled 2024-01-28 (×2): qty 0.4

## 2024-01-28 MED ORDER — COCONUT OIL OIL: 1.0000 | TOPICAL_OIL | Status: DC | PRN

## 2024-01-28 MED ORDER — DEXAMETHASONE SODIUM PHOSPHATE 4 MG/ML IJ SOLN
INTRAMUSCULAR | Status: AC
  Filled 2024-01-28: qty 2

## 2024-01-28 MED ORDER — IBUPROFEN 600 MG PO TABS
600.0000 mg | ORAL_TABLET | Freq: Four times a day (QID) | ORAL | Status: DC
  Administered 2024-01-29 – 2024-01-30 (×4): 600 mg via ORAL
  Filled 2024-01-28 (×4): qty 1

## 2024-01-28 MED ORDER — OXYTOCIN-SODIUM CHLORIDE 30-0.9 UT/500ML-% IV SOLN: 2.5000 [IU]/h | INTRAVENOUS | Status: AC

## 2024-01-28 MED ORDER — PHENYLEPHRINE 80 MCG/ML (10ML) SYRINGE FOR IV PUSH (FOR BLOOD PRESSURE SUPPORT)
PREFILLED_SYRINGE | INTRAVENOUS | Status: AC
  Filled 2024-01-28: qty 10

## 2024-01-28 MED ORDER — DIBUCAINE (PERIANAL) 1 % EX OINT: 1.0000 | TOPICAL_OINTMENT | CUTANEOUS | Status: DC | PRN

## 2024-01-28 MED ORDER — SODIUM CHLORIDE 0.9 % IV SOLN
INTRAVENOUS | Status: DC | PRN
  Administered 2024-01-28: 500 mg via INTRAVENOUS

## 2024-01-28 MED ORDER — DEXMEDETOMIDINE HCL IN NACL 80 MCG/20ML IV SOLN
INTRAVENOUS | Status: DC | PRN
  Administered 2024-01-28 (×2): 8 ug via INTRAVENOUS

## 2024-01-28 MED ORDER — ONDANSETRON HCL 4 MG/2ML IJ SOLN
INTRAMUSCULAR | Status: DC | PRN
  Administered 2024-01-28: 4 mg via INTRAVENOUS

## 2024-01-28 MED ORDER — DIPHENHYDRAMINE HCL 50 MG/ML IJ SOLN: 12.5000 mg | INTRAMUSCULAR | Status: DC | PRN

## 2024-01-28 MED ORDER — ZOLPIDEM TARTRATE 5 MG PO TABS: 5.0000 mg | ORAL_TABLET | Freq: Every evening | ORAL | Status: DC | PRN

## 2024-01-28 MED ORDER — PHENYLEPHRINE HCL-NACL 20-0.9 MG/250ML-% IV SOLN
INTRAVENOUS | Status: AC
  Filled 2024-01-28: qty 250

## 2024-01-28 MED ORDER — METOCLOPRAMIDE HCL 5 MG/ML IJ SOLN
INTRAMUSCULAR | Status: DC | PRN
  Administered 2024-01-28: 5 mg via INTRAVENOUS

## 2024-01-28 MED ORDER — SIMETHICONE 80 MG PO CHEW
80.0000 mg | CHEWABLE_TABLET | ORAL | Status: DC | PRN
  Administered 2024-01-30: 80 mg via ORAL
  Filled 2024-01-28: qty 1

## 2024-01-28 MED ORDER — FENTANYL CITRATE (PF) 100 MCG/2ML IJ SOLN
INTRAMUSCULAR | Status: DC | PRN
  Administered 2024-01-28: 100 ug via EPIDURAL

## 2024-01-28 MED ORDER — ONDANSETRON HCL 4 MG/2ML IJ SOLN
INTRAMUSCULAR | Status: AC
  Filled 2024-01-28: qty 2

## 2024-01-28 MED ORDER — PRENATAL MULTIVITAMIN CH
1.0000 | ORAL_TABLET | Freq: Every day | ORAL | Status: DC
  Administered 2024-01-28 – 2024-01-30 (×3): 1 via ORAL
  Filled 2024-01-28 (×3): qty 1

## 2024-01-28 MED ORDER — MORPHINE SULFATE (PF) 0.5 MG/ML IJ SOLN
INTRAMUSCULAR | Status: DC | PRN
  Administered 2024-01-28: 3 mg via EPIDURAL

## 2024-01-28 MED ORDER — DIPHENHYDRAMINE HCL 25 MG PO CAPS
25.0000 mg | ORAL_CAPSULE | Freq: Four times a day (QID) | ORAL | Status: DC | PRN
  Administered 2024-01-28: 25 mg via ORAL

## 2024-01-28 MED ORDER — PHENYLEPHRINE HCL (PRESSORS) 10 MG/ML IV SOLN
INTRAVENOUS | Status: DC | PRN
  Administered 2024-01-28 (×2): 160 ug via INTRAVENOUS

## 2024-01-28 MED ORDER — OXYTOCIN-SODIUM CHLORIDE 30-0.9 UT/500ML-% IV SOLN
INTRAVENOUS | Status: DC | PRN
  Administered 2024-01-28: 300 mL via INTRAVENOUS

## 2024-01-28 MED ORDER — METOCLOPRAMIDE HCL 5 MG/ML IJ SOLN
INTRAMUSCULAR | Status: AC
  Filled 2024-01-28: qty 2

## 2024-01-28 MED ORDER — KETOROLAC TROMETHAMINE 30 MG/ML IJ SOLN
30.0000 mg | Freq: Four times a day (QID) | INTRAMUSCULAR | Status: AC
  Administered 2024-01-28 – 2024-01-29 (×4): 30 mg via INTRAVENOUS
  Filled 2024-01-28 (×4): qty 1

## 2024-01-28 MED ORDER — DIPHENHYDRAMINE HCL 25 MG PO CAPS
25.0000 mg | ORAL_CAPSULE | ORAL | Status: DC | PRN
  Filled 2024-01-28: qty 1

## 2024-01-28 MED ORDER — NALOXONE HCL 4 MG/10ML IJ SOLN: 1.0000 ug/kg/h | INTRAVENOUS | Status: DC | PRN

## 2024-01-28 SURGICAL SUPPLY — 37 items
BENZOIN TINCTURE PRP APPL 2/3 (GAUZE/BANDAGES/DRESSINGS) IMPLANT
CELLS DAT CNTRL 66122 CELL SVR (MISCELLANEOUS) ×1 IMPLANT
CHLORAPREP W/TINT 26 (MISCELLANEOUS) ×2 IMPLANT
CLAMP UMBILICAL CORD (MISCELLANEOUS) ×1 IMPLANT
CLOTH BEACON ORANGE TIMEOUT ST (SAFETY) ×1 IMPLANT
DRSG OPSITE POSTOP 4X10 (GAUZE/BANDAGES/DRESSINGS) ×1 IMPLANT
DRSG OPSITE POSTOP 4X8 (GAUZE/BANDAGES/DRESSINGS) IMPLANT
ELECT REM PT RETURN 9FT ADLT (ELECTROSURGICAL) ×1
ELECTRODE REM PT RTRN 9FT ADLT (ELECTROSURGICAL) ×1 IMPLANT
EXTRACTOR VACUUM M CUP 4 TUBE (SUCTIONS) IMPLANT
GAUZE SPONGE 4X4 12PLY STRL (GAUZE/BANDAGES/DRESSINGS) IMPLANT
GLOVE BIOGEL PI IND STRL 7.0 (GLOVE) ×2 IMPLANT
GLOVE BIOGEL PI IND STRL 8 (GLOVE) ×1 IMPLANT
GLOVE ECLIPSE 7.5 STRL STRAW (GLOVE) ×1 IMPLANT
GOWN STRL REUS W/TWL LRG LVL3 (GOWN DISPOSABLE) ×2 IMPLANT
GOWN STRL REUS W/TWL XL LVL3 (GOWN DISPOSABLE) ×1 IMPLANT
KIT ABG SYR 3ML LUER SLIP (SYRINGE) IMPLANT
MAT PREVALON FULL STRYKER (MISCELLANEOUS) IMPLANT
NDL HYPO 25X5/8 SAFETYGLIDE (NEEDLE) IMPLANT
NEEDLE HYPO 25X5/8 SAFETYGLIDE (NEEDLE) IMPLANT
NS IRRIG 1000ML POUR BTL (IV SOLUTION) ×1 IMPLANT
PACK C SECTION WH (CUSTOM PROCEDURE TRAY) ×1 IMPLANT
PAD ABD 8X10 STRL (GAUZE/BANDAGES/DRESSINGS) IMPLANT
PAD OB MATERNITY 4.3X12.25 (PERSONAL CARE ITEMS) ×1 IMPLANT
RETRACTOR WND ALEXIS 18 MED (MISCELLANEOUS) IMPLANT
RTRCTR C-SECT PINK 25CM LRG (MISCELLANEOUS) ×1 IMPLANT
RTRCTR WOUND ALEXIS 18CM MED (MISCELLANEOUS) ×1
STRIP CLOSURE SKIN 1/2X4 (GAUZE/BANDAGES/DRESSINGS) IMPLANT
SUT MNCRL 0 VIOLET CTX 36 (SUTURE) ×2 IMPLANT
SUT MNCRL AB 4-0 PS2 18 (SUTURE) IMPLANT
SUT PLAIN ABS 2-0 CT1 27XMFL (SUTURE) IMPLANT
SUT VIC AB 0 CTX36XBRD ANBCTRL (SUTURE) ×1 IMPLANT
SUT VIC AB 2-0 CT1 TAPERPNT 27 (SUTURE) ×1 IMPLANT
SUT VIC AB 4-0 KS 27 (SUTURE) ×1 IMPLANT
TOWEL OR 17X24 6PK STRL BLUE (TOWEL DISPOSABLE) ×1 IMPLANT
TRAY FOLEY W/BAG SLVR 14FR LF (SET/KITS/TRAYS/PACK) ×1 IMPLANT
WATER STERILE IRR 1000ML POUR (IV SOLUTION) ×1 IMPLANT

## 2024-01-28 NOTE — Anesthesia Postprocedure Evaluation (Signed)
Anesthesia Post Note  Patient: REENA BORROMEO  Procedure(s) Performed: CESAREAN SECTION     Patient location during evaluation: PACU Anesthesia Type: Epidural Level of consciousness: oriented and awake and alert Pain management: pain level controlled Vital Signs Assessment: post-procedure vital signs reviewed and stable Respiratory status: spontaneous breathing, respiratory function stable and nonlabored ventilation Cardiovascular status: blood pressure returned to baseline and stable Postop Assessment: no headache, no backache, no apparent nausea or vomiting, epidural receding and patient able to bend at knees Anesthetic complications: no   No notable events documented.  Last Vitals:  Vitals:   01/28/24 1154 01/28/24 1308  BP: (!) 95/55 117/70  Pulse: 85 82  Resp: 16 17  Temp: 36.7 C 36.4 C  SpO2: 100% 99%    Last Pain:  Vitals:   01/28/24 1308  TempSrc: Axillary  PainSc:    Pain Goal: Patients Stated Pain Goal: Other (Comment) (patient is undecided, discussed options for medical management of pain, declines intervention at this time, coping well on birthing ball) (01/26/24 1930)                 Jermel Artley A.

## 2024-01-28 NOTE — Progress Notes (Signed)
Pt requesting to discontinue pitocin.   [name], MD FMOB Fellow, Faculty practice [facility], Center for [facility]

## 2024-01-28 NOTE — Op Note (Signed)
Cesarean Section Operative Note   Patient: [name]  Date of Procedure: 01/28/2024  Procedure: Repeat Low Transverse Cesarean   Indications: Failed induction with previous uterine incision: low transverse  Pre-operative Diagnosis: Unscheduled Cesarean Section, See Delivery Summary.   Post-operative Diagnosis: Same  TOLAC Candidate: No  Surgeon: Surgeons and Role:    * Celedonio Savage, MD - Primary    * Lennart Pall, MD - Assisting  Assistants: none  An experienced assistant was required given the standard of surgical care given the complexity of the case.  This assistant was needed for exposure, dissection, suctioning, retraction, instrument exchange, assisting with delivery with administration of fundal pressure, and for overall help during the procedure.   Anesthesia: epidural  Anesthesiologist: Mal Amabile, MD   Antibiotics: Cefazolin and Azithromycin   Estimated Blood Loss: 768 ml   Total IV Fluids: 2000 ml  Urine Output:  100 cc OF clear urine  Specimens: Placenta  Complications: no complications   Indications: [name] is a 38 y.o. [phone] with an IUP [redacted]w[redacted]d presenting for unscheduled, urgent cesarean secondary to the indications listed above. Clinical course notable for induction of labor.  Progression of 5 cm dilated.  Failed induction.  The risks of cesarean section discussed with the patient included but were not limited to: bleeding which may require transfusion or reoperation; infection which may require antibiotics; injury to bowel, bladder, ureters or other surrounding organs; injury to the fetus; need for additional procedures including hysterectomy in the event of a life-threatening hemorrhage; placental abnormalities with subsequent pregnancies, incisional problems, thromboembolic phenomenon and other postoperative/anesthesia complications. The patient concurred with the proposed plan, giving informed written consent for the procedure.  Patient . Anesthesia and OR aware. Preoperative prophylactic antibiotics and SCDs ordered on call to the OR.   Findings: Viable infant in cephalic presentation, shoulder cord present, reduced immediately Apgars 9, 9, . Weight 3170 g. Clear amniotic fluid. Densely adherent placenta, three vessel cord. Normal uterus, Normal bilateral fallopian tubes, Normal bilateral ovaries. Minimal adhesive disease.  Procedure Details: A Time Out was held and the above information confirmed. The patient received intravenous antibiotics and had sequential compression devices applied to her lower extremities preoperatively. The patient was taken back to the operative suite where epidural anesthesia was administered. After induction of anesthesia, the patient was draped and prepped in the usual sterile manner and placed in a dorsal supine position with a leftward tilt. A low transverse skin incision was made with scalpel and carried down through the subcutaneous tissue to the fascia. Fascial incision was made and extended transversely. The fascia was separated from the underlying rectus tissue superiorly and inferiorly. The rectus muscles were separated in the midline bluntly and the peritoneum was entered bluntly. An Alexis retractor was placed to aid in visualization of the uterus. A bladder flap was not developed. A low transverse uterine incision was made. The infant was successfully delivered from cephalic presentation, the umbilical cord was clamped after 1 minute. Cord ph was not sent, and cord blood was obtained for evaluation. The placenta was removed  mostly intact  and appeared  densely adherent to the uterus.  The plane between the placenta and the uterus was difficult to identify but we were ultimately able to find a plane and remove a few individual pieces of the placenta that remained . The uterine incision was closed with a single layer running unlocked suture of 0-Monocryl. Due to ongoing bleeding a second layer of  0 Monocryl  was placed in an imbricating fashion, after which there was excellent hemostasis. The abdomen and the pelvis were cleared of all clot and debris and the Jon Gills was removed. Hemostasis was confirmed on all surfaces.  The peritoneum was reapproximated using 2-0 vicryl . The fascia was then closed using 0 Vicryl in a running fashion. The subcutaneous layer was reapproximated with 2-0 plain gut suture. The skin was closed with a 4-0 vicryl subcuticular stitch. The patient tolerated the procedure well. Sponge, lap, instrument and needle counts were correct x 2. She was taken to the recovery room in stable condition.  Disposition: PACU - hemodynamically stable.    Signed: Celedonio Savage, MD Attending Family Medicine Physician, [facility] for [facility], [facility]

## 2024-01-28 NOTE — Lactation Note (Signed)
This note was copied from a baby's chart. Lactation Consultation Note  Patient Name: [name] [id number]'O Date: 01/28/2024 Age:37 hours Reason for consult: Initial assessment;Term;Breastfeeding assistance  P3- MOB reports that infant is a "natural" when it comes to breastfeeding so far. The RN had just finished an assessment on infant and he started rooting, so LC encouraged MOB to latch infant. MOB placed infant on the right breast in the football hold. Infant latched after two attempts and had a strong rhythmic suck. MOB denied experiencing any pain or discomfort. MOB denied having any questions or concerns at this time.  LC reviewed the first 24 hr birthday nap, feeding infant on cue 8-12x in 24 hrs, not allowing infant to go over 3 hrs without a feeding, CDC milk storage guidelines and LC services handout. LC encouraged MOB to call for further assistance as needed.  Maternal Data Has patient been taught Hand Expression?: No Does the patient have breastfeeding experience prior to this delivery?: Yes How long did the patient breastfeed?: 1 month of combo feeding with first child and 18 months of exclusive breastfeeding with second child  Feeding Mother's Current Feeding Choice: Breast Milk  LATCH Score Latch: Grasps breast easily, tongue down, lips flanged, rhythmical sucking.  Audible Swallowing: A few with stimulation  Type of Nipple: Everted at rest and after stimulation  Comfort (Breast/Nipple): Soft / non-tender  Hold (Positioning): No assistance needed to correctly position infant at breast.  LATCH Score: 9   Lactation Tools Discussed/Used Pump Education: Milk Storage  Interventions Interventions: Breast feeding basics reviewed;Assisted with latch;Breast compression;Adjust position;Support pillows;Position options;Education;LC Services brochure  Discharge Discharge Education: Warning signs for feeding baby Pump: DEBP;Hands Free;Personal  Consult Status Consult  Status: Follow-up Date: 01/29/24 Follow-up type: In-patient    Dema Severin BS, IBCLC 01/28/2024, 4:24 PM

## 2024-01-28 NOTE — Progress Notes (Addendum)
Labor Progress Note DONNETTE MACMULLEN is a 39 y.o. G3P2002 at [redacted]w[redacted]d presented for IOL for polyhydramnios (TOLAC after 2 prior CS for NRFHT) S: comfortable with epidural, strongly desires to continue TOLAC. Open to pitocin break after discussed of concern with decreasing frequency of contractions despite increasing pitocin to 28u  O:  BP 118/93, HR 126, SpO2 99, T98.4  EFM: baseline 145, accels, no decels, moderate variability TOCO: occasional contractions  CVE: Dilation: 5 Effacement (%): 50 Cervical Position: Middle Station: -3 Presentation: Vertex Exam by:: Dr Leanora Cover   A&P: 38 y.o. [id number] [redacted]w[redacted]d admitted for IOL #Labor: No cervical change since AROM. Will trial a 2 hour pitocin break and 2 tabs of tums. Continue regular repositioning to facilitate station improvement #Pain: Epidural #FWB: Cat I #GBS positive; PCN    [name], MD 6:49 AM

## 2024-01-28 NOTE — Progress Notes (Signed)
Pt and family discussing plan. Comfortable with epidural. Pt conflicted on next best steps Currently declining further increases of pitocin while considering options.   Blood pressure (!) 102/52, pulse (!) 106, temperature 98.8 F (37.1 C), temperature source Oral, resp. rate 18, height 4\' 9"  (1.448 m), weight 98.3 kg, last menstrual period 04/27/2023, SpO2 100%, unknown if currently breastfeeding.  EFM: Cat I; baseline 150, accels present, no decels TOCO: q17-23min contractions  Pt aware of criteria of failed induction at 24 hours of ROM and pitocin Pt aware of criteria of arrest of dilation after 6 hours of inadequate contractions for >6 hours Pt aware of reasonable option of repeat c-section at anytime  Will provide time and space for family discussion and decision making.  Pt aware of my availability to discuss plan of care when ready  [name], MD FMOB Fellow, Faculty practice [facility], Center for [facility]

## 2024-01-28 NOTE — Progress Notes (Addendum)
  Labor Progress Note [name] is a 38 y.o. G3P2002 at [redacted]w[redacted]d presented for IOL for polyhydramnios (TOLAC after 2 prior CS for NRFHT) S: comfortable with epidural, strongly desires to continue TOLAC. Open to restart pitocin at 6u. O:  BP (!) 102/52 (BP Location: Left Arm)   Pulse (!) 106   Temp 98.8 F (37.1 C) (Oral) Comment: thermostat set on 76 per patient's mother's request, and patient's recently (within 10 minutes) finished drinking hot broth  Resp 18   Ht 4\' 9"  (1.448 m)   Wt 98.3 kg   LMP 04/27/2023 (Exact Date)   SpO2 100%   BMI 46.92 kg/m    EFM: baseline 145, accels, no decels, moderate variability TOCO: occasional contractions   CVE: Dilation: 5 Effacement (%): 50 Cervical Position: Middle Station: -3 Presentation: Vertex Exam by:: Dr Leanora Cover     A&P: 38 y.o. [id number] [redacted]w[redacted]d admitted for IOL #Labor: Restart pitocin at 6u #Pain: Epidural #FWB: Cat I #GBS positive; PCN     [name], MD FMOB Fellow, Faculty practice [facility], Center for [employer]

## 2024-01-28 NOTE — Discharge Summary (Addendum)
Postpartum Discharge Summary  Date of Service updated***     Patient Name: [name] DOB: [birth year] MRN: [id number]  Date of admission: 01/26/2024 Delivery date:01/28/2024 Delivering provider: Celedonio Savage Date of discharge: 01/28/2024  Admitting diagnosis: Encounter for induction of labor [Z34.90] Intrauterine pregnancy: [redacted]w[redacted]d     Secondary diagnosis:  Active Problems:   Supervision of other high risk pregnancy, antepartum   History of C-section x 2   AMA (advanced maternal age) multigravida 35+   Obesity affecting pregnancy   Polyhydramnios affecting pregnancy   Pregnancy complicated by mild fetal ventriculomegaly   Cesarean delivery delivered  Additional problems: None    Discharge diagnosis: Term Pregnancy Delivered                                              Post partum procedures:{Postpartum procedures:23558} Augmentation: AROM, Pitocin, and IP Foley Complications: None  Hospital course: Induction of Labor With Cesarean Section   38 y.o. yo G3P3003 at [redacted]w[redacted]d was admitted to the hospital 01/26/2024 for induction of labor for polyhydramnios. Patient had a prolonged induction. She was unchanged  after 12 hours ruptured on pitocin and opted for repeat cesarean delivery for failed IOL. The patient went for cesarean section due to  failed IOL . Delivery details are as follows: Membrane Rupture Time/Date: 7:57 PM,01/28/2024  Delivery Method:C-Section, Low Transverse Operative Delivery:N/A Details of operation can be found in separate operative Note.  Patient had a postpartum course complicated by***. She is ambulating, tolerating a regular diet, passing flatus, and urinating well.  Patient is discharged home in stable condition on 01/28/24.      Newborn Data: Birth date:01/28/2024 Birth time:9:46 AM Gender:Female Living status:Living Apgars:9 ,9  Weight:3170 g                               Magnesium Sulfate received: {Mag received:30440022} BMZ received:  No Rhophylac:N/A MMR:N/A T-DaP:Given prenatally Flu: No RSV Vaccine received: Yes Transfusion:{Transfusion received:30440034}  Immunizations received: Immunization History  Administered Date(s) Administered   Influenza-Unspecified 12/21/2021   PFIZER Comirnaty(Gray Top)Covid-19 Tri-Sucrose Vaccine 12/21/2021, 01/11/2022   Rsv, Bivalent, Protein Subunit Rsvpref,pf Verdis Frederickson) 12/23/2023   Tdap 01/11/2016, 12/23/2023   Physical exam  Vitals:   01/28/24 1048 01/28/24 1100 01/28/24 1115 01/28/24 1120  BP: 105/68 (!) 93/57 (!) 85/52 (!) 97/54  Pulse: 94 92 85 87  Resp: 17 15 16 16   Temp:  97.7 F (36.5 C)    TempSrc:  Oral    SpO2: 94% 95% 98% 96%  Weight:      Height:       General: {Exam; general:21111117} Lochia: {Desc; appropriate/inappropriate:30686::"appropriate"} Uterine Fundus: {Desc; firm/soft:30687} Incision: {Exam; incision:21111123} DVT Evaluation: {Exam; dvt:2111122} Labs: Lab Results  Component Value Date   WBC 7.7 01/26/2024   HGB 13.3 01/26/2024   HCT 40.0 01/26/2024   MCV 79.7 (L) 01/26/2024   PLT 276 01/26/2024      Latest Ref Rng & Units 09/20/2022    7:14 PM  CMP  Glucose 70 - 99 mg/dL 80   BUN 6 - 20 mg/dL 11   Creatinine 4.09 - 1.00 mg/dL 8.11   Sodium 914 - 782 mmol/L 135   Potassium 3.5 - 5.1 mmol/L 3.5   Chloride 98 - 111 mmol/L 105   CO2 22 - 32 mmol/L 22  Calcium 8.9 - 10.3 mg/dL 8.7   Total Protein 6.5 - 8.1 g/dL 7.9   Total Bilirubin 0.3 - 1.2 mg/dL 0.5   Alkaline Phos 38 - 126 U/L 59   AST 15 - 41 U/L 20   ALT 0 - 44 U/L 13    Edinburgh Score:     No data to display         No data recorded  After visit meds:  Allergies as of 01/28/2024   No Known Allergies   Med Rec must be completed prior to using this [facility]***        Discharge home in stable condition Infant Feeding: Breast Infant Disposition:{CHL IP OB HOME WITH BJYNWG:95621} Discharge instruction: per After Visit Summary and Postpartum  booklet. Activity: Advance as tolerated. Pelvic rest for 6 weeks.  Diet: routine diet Future Appointments: Future Appointments  Date Time Provider Department Center  03/05/2024  1:50 PM Levie Heritage, DO [facility] None   Follow up Visit:  Sent by Berton Lan 2/18  Please schedule this patient for a In person postpartum visit in 4 weeks with the following provider: Any provider. Additional Postpartum F/U:Incision check 1 week  Low risk pregnancy complicated by:  prior CS x 2, polyhydramnios Delivery mode:  C-Section, Low Transverse Anticipated Birth Control:  IUD   01/28/2024 Lennart Pall, MD

## 2024-01-28 NOTE — Progress Notes (Signed)
  Labor Progress Note [name] is a 38 y.o. G3P2002 at [redacted]w[redacted]d presented for IOL for polyhydramnios (TOLAC after 2 prior CS for NRFHT) S: Patient is resting comfortably with epidural.  Pitocin is currently off O:  BP (!) 102/52 (BP Location: Left Arm)   Pulse (!) 106   Temp 98.8 F (37.1 C) (Oral) Comment: thermostat set on 76 per patient's mother's request, and patient's recently (within 10 minutes) finished drinking hot broth  Resp 18   Ht 4\' 9"  (1.448 m)   Wt 98.3 kg   LMP 04/27/2023 (Exact Date)   SpO2 100%   BMI 46.92 kg/m    EFM: baseline 145, accels, no decels, moderate variability TOCO: No contractions   CVE: Dilation: 5 Effacement (%): 50 Cervical Position: Middle Station: -3 Presentation: Vertex Exam by:: Dr Leanora Cover     A&P: 38 y.o. W7P7106 [redacted]w[redacted]d admitted for IOL #Labor: Pitocin cut off approximately 1 hour ago.  Patient feels that she is making no progress and is open to repeat cesarean delivery.  Is adamant that she does not want an emergency C-section.  We will proceed with repeat cesarean section.  The risks of cesarean section discussed with the patient included but were not limited to: bleeding which may require transfusion or reoperation; infection which may require antibiotics; injury to bowel, bladder, ureters or other surrounding organs; injury to the fetus; need for additional procedures including hysterectomy in the event of a life-threatening hemorrhage; placental abnormalities with subsequent pregnancies, incisional problems, thromboembolic phenomenon and other postoperative/anesthesia complications. The patient concurred with the proposed plan, giving informed written consent for the procedure. Patient has been n.p.o. since epidural placed. Anesthesia and OR aware. Preoperative prophylactic antibiotics and SCDs ordered on call to the OR.   #Pain: Epidural #FWB: Cat I #GBS positive; PCN     Derrel Nip, MD Attending Family Medicine Physician,  [facility] for [facility], [facility]

## 2024-01-28 NOTE — Transfer of Care (Signed)
Immediate Anesthesia Transfer of Care Note  Patient: [name]  Procedure(s) Performed: CESAREAN SECTION  Patient Location: PACU  Anesthesia Type:Epidural  Level of Consciousness: awake, alert , and oriented  Airway & Oxygen Therapy: Patient Spontanous Breathing  Post-op Assessment: Report given to RN and Post -op Vital signs reviewed and stable  Post vital signs: Reviewed and stable  Last Vitals:  Vitals Value Taken Time  BP 105/68 01/28/24 1047  Temp    Pulse 96 01/28/24 1049  Resp 21 01/28/24 1049  SpO2 97 % 01/28/24 1049  Vitals shown include unfiled device data.  Last Pain:  Vitals:   01/28/24 0908  TempSrc: Oral  PainSc:       Patients Stated Pain Goal: Other (Comment) (patient is undecided, discussed options for medical management of pain, declines intervention at this time, coping well on birthing ball) (01/26/24 1930)  Complications: No notable events documented.

## 2024-01-29 ENCOUNTER — Encounter: Payer: Medicaid Other | Admitting: Family Medicine

## 2024-01-29 LAB — CBC
HCT: 35.2 % — ABNORMAL LOW (ref 36.0–46.0)
Hemoglobin: 11.8 g/dL — ABNORMAL LOW (ref 12.0–15.0)
MCH: 27 pg (ref 26.0–34.0)
MCHC: 33.5 g/dL (ref 30.0–36.0)
MCV: 80.5 fL (ref 80.0–100.0)
Platelets: 236 10*3/uL (ref 150–400)
RBC: 4.37 MIL/uL (ref 3.87–5.11)
RDW: 15.1 % (ref 11.5–15.5)
WBC: 14.5 10*3/uL — ABNORMAL HIGH (ref 4.0–10.5)
nRBC: 0.2 % (ref 0.0–0.2)

## 2024-01-29 MED ORDER — FERROUS SULFATE 325 (65 FE) MG PO TABS
325.0000 mg | ORAL_TABLET | ORAL | Status: DC
  Administered 2024-01-29: 325 mg via ORAL
  Filled 2024-01-29: qty 1

## 2024-01-29 NOTE — Progress Notes (Signed)
Postpartum Day 1: Cesarean Delivery  Subjective: Patient reports incisional pain, tolerating PO, and no problems voiding.  No flatus yet.  Breastfeeding, baby boy stable at bedside. Moderate lochia reported.   Objective: Vital signs in last 24 hours: Temp:  [97 F (36.1 C)-98.3 F (36.8 C)] 97.7 F (36.5 C) (02/19 0255) Pulse Rate:  [70-133] 70 (02/19 0255) Resp:  [15-18] 16 (02/19 0255) BP: (85-117)/(49-77) 92/49 (02/19 0255) SpO2:  [93 %-100 %] 98 % (02/19 0255)  Physical Exam:  General: alert and no distress Lochia: appropriate Uterine Fundus: firm, nontender Incision: healing well, no significant drainage, no dehiscence, no significant erythema, dressing C/D/I DVT Evaluation: No evidence of DVT seen on physical exam. Negative Homan's sign. No cords or calf tenderness. No significant calf/ankle edema.  Recent Labs    01/26/24 1649 01/29/24 0430  HGB 13.3 11.8*  HCT 40.0 35.2*    Assessment/Plan: Status post Cesarean section. Doing well postoperatively.  Patient was started on oral iron therapy for clinically significant but asymptomatic acute postoperative anemia due to expected blood loss. Breastfeeding, will get lactation consultation Plans to get outpatient IUD. Patient desires circumcision for her female infant.  Circumcision procedure details discussed, risks and benefits of procedure were also discussed.  These include but are not limited to: Benefits of circumcision in men include reduction in the rates of urinary tract infection (UTI), penile cancer, some sexually transmitted infections, penile inflammatory and retractile disorders, as well as easier hygiene.  Risks include bleeding , infection, injury of glans which may lead to penile deformity or urinary tract issues, unsatisfactory cosmetic appearance and other potential complications related to the procedure.  It was emphasized that this is an elective procedure.  Patient wants to proceed with circumcision; written  informed consent obtained.  Patient was told circumcision will be performed prior to infant's discharge pending approval by the pediatrician team, routine circumcision and post circumcision care ordered for the infant. Continue current care.    Jaynie Collins, MD 01/29/2024, 7:54 AM

## 2024-01-29 NOTE — Lactation Note (Signed)
This note was copied from a baby's chart. Lactation Consultation Note  Patient Name: [name] Date: 01/29/2024 Age:37 hours Reason for consult: Follow-up assessment  P3, Baby latched upon entering with good depth in cradle hold with intermittent swallows. Reviewed cluster feeding which mother states he seems to have already started.  Mother has viewed good flow with hand expression.  Denies questions or concerns at this time.  Maternal Data Has patient been taught Hand Expression?: Yes  Feeding Mother's Current Feeding Choice: Breast Milk  LATCH Score Latch: Grasps breast easily, tongue down, lips flanged, rhythmical sucking.  Audible Swallowing: A few with stimulation  Type of Nipple: Everted at rest and after stimulation  Comfort (Breast/Nipple): Soft / non-tender  Hold (Positioning): No assistance needed to correctly position infant at breast.  LATCH Score: 9 Interventions  Education  Consult Status Consult Status: Follow-up Date: 01/30/24 Follow-up type: In-patient   Hardie Pulley  RN, IBCLC 01/29/2024, 2:35 PM

## 2024-01-30 LAB — SURGICAL PATHOLOGY

## 2024-01-30 MED ORDER — IBUPROFEN 600 MG PO TABS: 600.0000 mg | ORAL_TABLET | Freq: Four times a day (QID) | ORAL | 0 refills | Status: AC | PRN

## 2024-01-30 MED ORDER — OXYCODONE HCL 5 MG PO TABS: 5.0000 mg | ORAL_TABLET | Freq: Four times a day (QID) | ORAL | 0 refills | Status: AC | PRN

## 2024-01-30 MED ORDER — ACETAMINOPHEN 500 MG PO TABS: 1000.0000 mg | ORAL_TABLET | Freq: Four times a day (QID) | ORAL | 0 refills | Status: AC | PRN

## 2024-01-30 MED ORDER — FERROUS SULFATE 325 (65 FE) MG PO TABS: 325.0000 mg | ORAL_TABLET | ORAL | 1 refills | Status: AC

## 2024-01-30 MED ORDER — SENNOSIDES-DOCUSATE SODIUM 8.6-50 MG PO TABS: 2.0000 | ORAL_TABLET | Freq: Every day | ORAL | 1 refills | Status: AC

## 2024-01-30 NOTE — Lactation Note (Signed)
This note was copied from a baby's chart. Lactation Consultation Note  Patient Name: [name] [id number] Date: 01/30/2024 Age:38 hours Reason for consult: Follow-up assessment;Maternal discharge;Term  P3, 39 wks, @ 52 hrs of life. DC anticipated. Discussed normal for baby to be sleepy post circumcision procedure for 1-2 feedings. Encouraged mom to start with hand expression and use breast compression through out feeding to keep baby working @ breast. Encouraged milk comes faster/easier with baby 3, breasts remember hormones of lactation. Discussed cluster feeding overnight/ early morning brings in our milk supply, shared expectations of milk coming in. Highlighted risk of engorgement. Discussed hand pump/express to soften breasts, motrin as anti-inflammatory, and ice packs for 10-20 minutes post feed/pumping if still over-full is the best treatments for inflamed/engorged breasts. Highlighted hand pump may be best at softening/ moving milk from engorged breast.Re-enforced LC services and milk storage with mom.   Maternal Data    Feeding Mother's Current Feeding Choice: Breast Milk  LATCH Score Latch: Grasps breast easily, tongue down, lips flanged, rhythmical sucking.  Audible Swallowing: Spontaneous and intermittent  Type of Nipple: Everted at rest and after stimulation  Comfort (Breast/Nipple): Soft / non-tender  Hold (Positioning): No assistance needed to correctly position infant at breast.  LATCH Score: 10   Lactation Tools Discussed/Used Breast pump type: Manual (Hand Pump Provided) Pump Education: Milk Storage  Interventions Interventions: Breast feeding basics reviewed;Hand express;Breast compression;Expressed milk;Coconut oil;Hand pump;DEBP;Education;LC Services brochure (Milk Storage Guidelines)  Discharge Discharge Education: Engorgement and breast care Pump: Personal;Manual;DEBP (Per mom has pump @ home, hand pump given to mom)  Consult Status Consult Status:  Complete Date: 01/30/24    Idamae Lusher 01/30/2024, 2:39 PM

## 2024-02-04 ENCOUNTER — Ambulatory Visit: Payer: Medicaid Other

## 2024-02-04 NOTE — Progress Notes (Signed)
 Subjective:     [name] is a 38 y.o. female who presents to the clinic 1 weeks status post low uterine, transverse cesarean section. Pt reports incision is healing well.      Objective:    BP 129/77   Pulse 98   Wt 201 lb (91.2 kg)   LMP 04/27/2023 (Exact Date)   BMI 43.50 kg/m  General:  alert, well appearing, in no apparent distress  Incision:   healing well, no drainage, no erythema, no hernia, no seroma, no swelling, no dehiscence, incision well approximated     Assessment:    Doing well postoperatively.   Plan:    1. Continue any current medications. 2. Wound care discussed. 3. Follow up: 03/05/24 with Dr. Adrian Blackwater.Danna Hefty, RN

## 2024-02-07 ENCOUNTER — Telehealth (HOSPITAL_COMMUNITY): Payer: Self-pay | Admitting: *Deleted

## 2024-02-07 NOTE — Telephone Encounter (Signed)
 02/07/2024  Name: SUHEY RADFORD MRN: [id number] DOB: 1986/07/28  Reason for Call:  Transition of Care Hospital Discharge Call  Contact Status: Patient Contact Status: Complete  Language assistant needed: Interpreter Mode: Interpreter Not Needed        Follow-Up Questions: Do You Have Any Concerns About Your Health As You Heal From Delivery?: No Do You Have Any Concerns About Your Infants Health?: No  Edinburgh Postnatal Depression Scale:  In the Past 7 Days: I have been able to laugh and see the funny side of things.: As much as I always could I have looked forward with enjoyment to things.: As much as I ever did I have blamed myself unnecessarily when things went wrong.: Not very often I have been anxious or worried for no good reason.: No, not at all I have felt scared or panicky for no good reason.: No, not at all Things have been getting on top of me.: No, I have been coping as well as ever I have been so unhappy that I have had difficulty sleeping.: Not at all I have felt sad or miserable.: Not very often I have been so unhappy that I have been crying.: No, never The thought of harming myself has occurred to me.: Never Edinburgh Postnatal Depression Scale Total: 2  PHQ2-9 Depression Scale:     Discharge Follow-up: Edinburgh score requires follow up?: No Patient was advised of the following resources:: Support Group, Breastfeeding Support Group  Post-discharge interventions: Reviewed Newborn Safe Sleep Practices  Salena Saner, RN 02/07/2024 15:02

## 2024-02-10 ENCOUNTER — Encounter: Payer: Self-pay | Admitting: Family

## 2024-02-13 ENCOUNTER — Encounter: Payer: Self-pay | Admitting: Family Medicine

## 2024-03-05 ENCOUNTER — Ambulatory Visit (INDEPENDENT_AMBULATORY_CARE_PROVIDER_SITE_OTHER): Payer: Medicaid Other | Admitting: Family Medicine

## 2024-03-05 DIAGNOSIS — Z30430 Encounter for insertion of intrauterine contraceptive device: Secondary | ICD-10-CM | POA: Diagnosis not present

## 2024-03-05 DIAGNOSIS — D509 Iron deficiency anemia, unspecified: Secondary | ICD-10-CM | POA: Diagnosis not present

## 2024-03-05 DIAGNOSIS — Z3202 Encounter for pregnancy test, result negative: Secondary | ICD-10-CM | POA: Diagnosis not present

## 2024-03-05 LAB — POCT URINE PREGNANCY: Preg Test, Ur: NEGATIVE

## 2024-03-05 MED ORDER — LEVONORGESTREL 20 MCG/DAY IU IUD
1.0000 | INTRAUTERINE_SYSTEM | Freq: Once | INTRAUTERINE | Status: AC
  Administered 2024-03-05: 1 via INTRAUTERINE

## 2024-03-05 NOTE — Progress Notes (Signed)
 Post Partum Visit Note  [name] is a 38 y.o. [id number] female who presents for a postpartum visit. She is 5 weeks postpartum following a repeat cesarean section.  I have fully reviewed the prenatal and intrapartum course. The delivery was at 39 gestational weeks.  Anesthesia: epidural. Postpartum course has been normal. Baby is doing well. Baby is feeding by breast. Bleeding staining only. Bowel function is normal. Bladder function is normal. Patient is not sexually active. Contraception method is IUD. Postpartum depression screening: negative.   Having some dizziness when walking around. Doesn't eat well - sometimes only 2 meals a day. Tries to drink a lot of water.  The pregnancy intention screening data noted above was reviewed. Potential methods of contraception were discussed. The patient elected to proceed with No data recorded.    Health Maintenance Due  Topic Date Due   Pneumococcal Vaccine 62-33 Years old (1 of 2 - PCV) Never done   INFLUENZA VACCINE  07/11/2023   COVID-19 Vaccine (3 - 2024-25 season) 08/11/2023    The following portions of the patient's history were reviewed and updated as appropriate: allergies, current medications, past family history, past medical history, past social history, past surgical history, and problem list.  Review of Systems Pertinent items are noted in HPI.  Objective:  LMP 04/27/2023 (Exact Date)    General:  alert, cooperative, and no distress   Breasts:  not indicated  Lungs: clear to auscultation bilaterally  Heart:  regular rate and rhythm, S1, S2 normal, no murmur, click, rub or gallop  Abdomen: soft, non-tender; bowel sounds normal; no masses,  no organomegaly   Wound well approximated incision  GU exam:  normal       IUD Procedure Note Patient identified, informed consent performed, signed copy in chart, time out was performed.  Urine pregnancy test negative.  Speculum placed in the vagina.  Cervix visualized.  Cleaned  with Betadine x 2.  Paracervical block placed with Lidocaine 2% with epinephrine 10mL spread between the 12 o'clock, 4 o'clock, 8 o'clock positions. Cervix grasped anteriorly with a single tooth tenaculum.  Uterus sounded to 8 cm.  Mirena  IUD placed per manufacturer's recommendations.  Strings trimmed to 3 cm. Tenaculum was removed, good hemostasis noted.  Patient tolerated procedure well.   Patient given post procedure instructions and Mirena care card with expiration date.  Patient is asked to check IUD strings periodically and follow up in 4-6 weeks for IUD check.    Assessment:   1. Postpartum care and examination (Primary) - levonorgestrel (MIRENA) 20 MCG/DAY IUD 1 each - POCT urine pregnancy  2. Encounter for insertion of Mirena IUD - levonorgestrel (MIRENA) 20 MCG/DAY IUD 1 each - POCT urine pregnancy  3. Iron deficiency anemia, unspecified iron deficiency anemia type Check CBC as cause of dizziness, but likely due to not eating well. Recommended eating snacks while breastfeeding to keep up on nutrition - CBC   Plan:   Essential components of care per ACOG recommendations:  1.  Mood and well being: Patient with negative depression screening today. Reviewed local resources for support.  - Patient tobacco use? No.   - hx of drug use? No.    2. Infant care and feeding:  -Patient currently breastmilk feeding? Yes. Reviewed importance of draining breast regularly to support lactation.  -Social determinants of health (SDOH) reviewed in EPIC. No concerns  3. Sexuality, contraception and birth spacing - Patient does not want a pregnancy in the next year.   -  Reviewed reproductive life planning. Reviewed contraceptive methods based on pt preferences and effectiveness.  Patient desired IUD or IUS today.   - Discussed birth spacing of 18 months  4. Sleep and fatigue -Encouraged family/partner/community support of 4 hrs of uninterrupted sleep to help with mood and fatigue  5.  Physical Recovery  - Discussed patients delivery and complications. She describes her labor as good. - Patient had a C-section.  - Patient has urinary incontinence? No. - Patient is safe to resume physical and sexual activity  6.  Health Maintenance - HM due items addressed Yes - Last pap smear  Diagnosis  Date Value Ref Range Status  01/23/2022   Final   - Negative for Intraepithelial Lesions or Malignancy (NILM)  01/23/2022 - Benign reactive/reparative changes  Final   Pap smear not done at today's visit.  -Breast Cancer screening indicated? No.   7. Chronic Disease/Pregnancy Condition follow up: None  - PCP follow up  chiquita l wilson, CMA [facility] [employer], [facility]

## 2024-03-06 ENCOUNTER — Encounter: Payer: Self-pay | Admitting: Family Medicine

## 2024-03-06 LAB — CBC
Hematocrit: 44.4 % (ref 34.0–46.6)
Hemoglobin: 14.1 g/dL (ref 11.1–15.9)
MCH: 25.7 pg — ABNORMAL LOW (ref 26.6–33.0)
MCHC: 31.8 g/dL (ref 31.5–35.7)
MCV: 81 fL (ref 79–97)
Platelets: 349 10*3/uL (ref 150–450)
RBC: 5.49 x10E6/uL — ABNORMAL HIGH (ref 3.77–5.28)
RDW: 14.3 % (ref 11.7–15.4)
WBC: 5.7 10*3/uL (ref 3.4–10.8)

## 2024-04-02 ENCOUNTER — Ambulatory Visit: Admitting: Family Medicine

## 2024-04-02 VITALS — BP 101/57 | HR 79 | Wt 197.0 lb

## 2024-04-02 DIAGNOSIS — Z30431 Encounter for routine checking of intrauterine contraceptive device: Secondary | ICD-10-CM

## 2024-04-02 NOTE — Progress Notes (Signed)
   Subjective:   Patient Name: [name], female   DOB: 01/30/1986, 38 y.o.  MRN: [id number]  HPI Patient here for an IUD check.  She had the Mirena  IUD placed 1 month ago.  She reports bleeding that has finally decreased to brown discharge.   Review of Systems  Constitutional: Negative for fever and chills.  Gastrointestinal: Negative for abdominal pain.  Genitourinary: Negative for vaginal discharge, vaginal pain, pelvic pain and dyspareunia.        Objective:   Physical Exam  Constitutional: She appears well-developed and well-nourished.  HENT:  Head: Normocephalic and atraumatic.  Abdominal: Soft. There is no tenderness. There is no guarding.  Genitourinary: There is no rash, tenderness or lesion on the right labia. There is no rash, tenderness or lesion on the left labia. No erythema or tenderness in the vagina. No foreign body around the vagina. No signs of injury around the vagina. No vaginal discharge found.    Skin: Skin is warm and dry.  Psychiatric: She has a normal mood and affect. Her behavior is normal. Judgment and thought content normal.       Assessment & Plan:  1. IUD check up IUD in place.  Will wait 2-3 more weeks. Pt to call with any other problems.  Recheck in 1 year.
